# Patient Record
Sex: Male | Born: 1990 | Race: Black or African American | Hispanic: No | Marital: Single
Health system: Southern US, Community
[De-identification: ages and names within clinical notes are randomized; demographics above are authoritative.]

## PROBLEM LIST (undated history)

## (undated) DIAGNOSIS — F419 Anxiety disorder, unspecified: Secondary | ICD-10-CM

## (undated) DIAGNOSIS — F909 Attention-deficit hyperactivity disorder, unspecified type: Secondary | ICD-10-CM

---
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
## 2010-11-10 LAB — CBC
HCT: 39.7
Platelets: 182
RDW: 13.2

## 2010-11-10 LAB — POCT CARDIAC MARKERS
Myoglobin, poc: 168
Operator id: 277751
Troponin i, poc: <0.05

## 2010-11-10 LAB — I-STAT 8, (EC8 V) (CONVERTED LAB)
Bicarbonate: 22.5
Glucose, Bld: 95
Sodium: 140
TCO2: 24
pCO2, Ven: 40.4 — ABNORMAL LOW
pH, Ven: 7.354 — ABNORMAL HIGH

## 2010-11-10 LAB — POCT I-STAT CREATININE: Operator id: 277751

## 2010-11-10 LAB — ETHANOL: Alcohol, Ethyl (B): <5

## 2010-11-10 LAB — RAPID URINE DRUG SCREEN, HOSP PERFORMED: Cocaine: NOT DETECTED

## 2015-02-10 ENCOUNTER — Emergency Department (HOSPITAL_COMMUNITY)
Admission: EM | Admit: 2015-02-10 | Discharge: 2015-02-11 | Disposition: A | Discharge status: Home / Self Care | Payer: Federal, State, Local not specified - Other | Attending: Emergency Medicine | Admitting: Emergency Medicine

## 2015-02-10 ENCOUNTER — Encounter (HOSPITAL_COMMUNITY): Payer: Self-pay | Admitting: Emergency Medicine

## 2015-02-10 ENCOUNTER — Inpatient Hospital Stay (HOSPITAL_COMMUNITY)
Admission: AD | Admit: 2015-02-10 | Payer: Federal, State, Local not specified - Other | Source: Intra-hospital | Admitting: Psychiatry

## 2015-02-10 DIAGNOSIS — R000 Tachycardia, unspecified: Secondary | ICD-10-CM | POA: Insufficient documentation

## 2015-02-10 DIAGNOSIS — F19950 Other psychoactive substance use, unspecified with psychoactive substance-induced psychotic disorder with delusions: Secondary | ICD-10-CM | POA: Diagnosis present

## 2015-02-10 DIAGNOSIS — F12150 Cannabis abuse with psychotic disorder with delusions: Secondary | ICD-10-CM | POA: Insufficient documentation

## 2015-02-10 DIAGNOSIS — R030 Elevated blood-pressure reading, without diagnosis of hypertension: Secondary | ICD-10-CM | POA: Insufficient documentation

## 2015-02-10 DIAGNOSIS — F17200 Nicotine dependence, unspecified, uncomplicated: Secondary | ICD-10-CM | POA: Insufficient documentation

## 2015-02-10 DIAGNOSIS — F1210 Cannabis abuse, uncomplicated: Secondary | ICD-10-CM | POA: Diagnosis present

## 2015-02-10 DIAGNOSIS — F419 Anxiety disorder, unspecified: Secondary | ICD-10-CM | POA: Insufficient documentation

## 2015-02-10 HISTORY — DX: Attention-deficit hyperactivity disorder, unspecified type: F90.9

## 2015-02-10 LAB — CBC
HEMATOCRIT: 41.1 % (ref 39.0–52.0)
Hemoglobin: 14.2 g/dL (ref 13.0–17.0)
MCH: 27.8 pg (ref 26.0–34.0)
MCHC: 34.5 g/dL (ref 30.0–36.0)
MCV: 80.4 fL (ref 78.0–100.0)
Platelets: 166 10*3/uL (ref 150–400)
RBC: 5.11 MIL/uL (ref 4.22–5.81)
RDW: 13.3 % (ref 11.5–15.5)
WBC: 10.8 10*3/uL — ABNORMAL HIGH (ref 4.0–10.5)

## 2015-02-10 LAB — COMPREHENSIVE METABOLIC PANEL
ALT: 23 U/L (ref 17–63)
AST: 41 U/L (ref 15–41)
Albumin: 5.2 g/dL — ABNORMAL HIGH (ref 3.5–5.0)
Alkaline Phosphatase: 57 U/L (ref 38–126)
Anion gap: 12 (ref 5–15)
BUN: 26 mg/dL — ABNORMAL HIGH (ref 6–20)
CHLORIDE: 104 mmol/L (ref 101–111)
CO2: 26 mmol/L (ref 22–32)
CREATININE: 1.27 mg/dL — AB (ref 0.61–1.24)
Calcium: 9.9 mg/dL (ref 8.9–10.3)
GFR calc Af Amer: >60 mL/min (ref >60)
Glucose, Bld: 83 mg/dL (ref 65–99)
Potassium: 4 mmol/L (ref 3.5–5.1)
Sodium: 142 mmol/L (ref 135–145)
Total Bilirubin: 1.6 mg/dL — ABNORMAL HIGH (ref 0.3–1.2)
Total Protein: 8.3 g/dL — ABNORMAL HIGH (ref 6.5–8.1)

## 2015-02-10 LAB — RAPID URINE DRUG SCREEN, HOSP PERFORMED
Amphetamines: NOT DETECTED
BARBITURATES: NOT DETECTED
Benzodiazepines: NOT DETECTED
Cocaine: NOT DETECTED
Opiates: NOT DETECTED
Tetrahydrocannabinol: POSITIVE — AB

## 2015-02-10 LAB — ETHANOL

## 2015-02-10 MED ORDER — ZIPRASIDONE MESYLATE 20 MG IM SOLR: 10.0000 mg | Freq: Four times a day (QID) | INTRAMUSCULAR | Status: DC | PRN

## 2015-02-10 NOTE — ED Provider Notes (Deleted)
20:50- Nursing is concerned that pt. Is continuing to be agitated, asking and demanding things, confrontational, and not sleeping. I observed him pacing the hallway, talking to other patients, and responding appropriately to the nurses tal;king to him. He has prn medication ordered.   I see no reason to change the current treatment at this time.    Mancel BaleElliott Denitra Donaghey, MD 02/10/15 2056

## 2015-02-10 NOTE — ED Notes (Signed)
Dr. Effie ShyWentz informed that patient [facility].

## 2015-02-10 NOTE — ED Notes (Signed)
Dr. Wentz at bedside. 

## 2015-02-10 NOTE — Progress Notes (Signed)
Entered in d/c instructions Please use the resources provided to you in emergency room by case manager to assist with doctor for follow up Schedule an appointment as soon as possible for a visit These [place] uninsured resources provide possible primary care providers, resources for discounted medications, housing, dental resources, affordable care act information, plus other resources for [place]   

## 2015-02-10 NOTE — ED Notes (Signed)
Pt states he woke up this morning morning and saw rats trapped in rat traps all over him. Pt then stripped down naked in the front yard. Pt Grandmother stated she did not see any rats. Pt stated that he smoked marijuana yesterday.

## 2015-02-10 NOTE — ED Notes (Signed)
Patient has been picking/ripping at clothing since being brought back to the unit.  He has ripped up 3 pair of scrubs because he believes rats are crawling on him and he needs to get them off.  Mother was in to visit and states he is usually very mild mannered and polite.  She left after a few minutes.

## 2015-02-10 NOTE — ED Notes (Signed)
Bed: [id number] Expected date:  Expected time:  Means of arrival:  Comments: 24 y/o M bizzare behavior

## 2015-02-10 NOTE — ED Notes (Signed)
Patient denies SI, HI and AVH at the this time. Plan of care discussed. Patient voices no complaints or concerns at this time. Encouragement and support provided and safety maintain. Q 15 min safety checks remain in place.
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
## 2015-02-10 NOTE — Progress Notes (Signed)
CM spoke with [place] who [place].  CM discussed and provided written information for uninsured accepting pcps, discussed the importance of pcp vs EDP services for f/u care, www.needymeds.org, www.goodrx.com, discounted pharmacies and other [place] resources such as [facility] , [facility], affordable care act, financial assistance, uninsured dental services, [place] med assist, DSS and  health department  Reviewed resources for [place] uninsured accepting pcps like [facility], [facility] medicine [facility] [address], [facility], [facility], local urgent care centers, [facility], [facility], general medical clinics, family services of the [place], [facility] urgent care plus others, medication resources, [facility] out patient pharmacies and housing    Provided [facility] contact information Pt did not agree at this time Pt appeared angry with a frown and spoke in single syllables

## 2015-02-10 NOTE — ED Provider Notes (Signed)
CSN: [id number]     Arrival date & time 02/10/15  1250 History   First MD Initiated Contact with Patient 02/10/15 1310     Chief Complaint  Patient presents with  . Psychiatric Evaluation    HPI  Patient presents due to family concerns of psychosis. He denies any medical / psychiatric conditions. However, he describes setting traps for multiple traps for rodents over the past day, awakening to find multiple rats trapped, and wondering why his relatives could not see the records. Per report, the patient was also naked, walking about the front yard. The patient acknowledges smoking marijuana, denies other drug use, denies any medical complaints at all.   Past Medical History  Diagnosis Date  . ADHD (attention deficit hyperactivity disorder)    History reviewed. No pertinent past surgical history. No family history on file. Social History  Substance Use Topics  . Smoking status: Current Every Day Smoker  . Smokeless tobacco: None  . Alcohol Use: No    Review of Systems  Unable to perform ROS: Psychiatric disorder      Allergies  Review of patient's allergies indicates no known allergies.  Home Medications   Prior to Admission medications   Not on File   BP 171/103 mmHg  Pulse 105  Temp(Src) 99.4 F (37.4 C) (Oral)  Resp 16  SpO2 99% Physical Exam  Constitutional: He is oriented to person, place, and time. He appears well-developed. No distress.  HENT:  Head: Normocephalic and atraumatic.  Eyes: Conjunctivae and EOM are normal.  Cardiovascular: Regular rhythm.  Tachycardia present.   Pulmonary/Chest: Effort normal. No stridor. No respiratory distress.  Abdominal: He exhibits no distension.  Musculoskeletal: He exhibits no edema.  Neurological: He is alert and oriented to person, place, and time.  Skin: Skin is warm and dry.  Psychiatric: His mood appears anxious. His speech is tangential. Thought content is delusional. Cognition and memory are impaired. He  expresses no homicidal and no suicidal ideation.  Nursing note and vitals reviewed.   ED Course  Procedures (including critical care time) Labs Review Labs Reviewed  COMPREHENSIVE METABOLIC PANEL - Abnormal; Notable for the following:    BUN 26 (*)    Creatinine, Ser 1.27 (*)    Total Protein 8.3 (*)    Albumin 5.2 (*)    Total Bilirubin 1.6 (*)    All other components within normal limits  CBC - Abnormal; Notable for the following:    WBC 10.8 (*)    All other components within normal limits  URINE RAPID DRUG SCREEN, HOSP PERFORMED - Abnormal; Notable for the following:    Tetrahydrocannabinol POSITIVE (*)    All other components within normal limits  ETHANOL      MDM  Young male presents with concern for psychosis. Patient has minimal tachycardia, minimal blood pressure elevation, but no evidence for distress. Patient is medically cleared for psychiatric evaluation.   Gerhard Munchobert Haylen Bellotti, MD 02/10/15 [id number]

## 2015-02-10 NOTE — ED Notes (Signed)
Patient states " I'm not going to behavioral health. There is nothing wrong with me". Writer tried to encourage patient that this would be beneficial for him but patient continues to refuse. [place], TTS, couns, informed. Encouragement and support provided and safety maintain. Q 15 min safety checks remain in place.

## 2015-02-10 NOTE — ED Provider Notes (Signed)
21:00- Asked to see the patient to initiate IVC, because [facility]. At this time. He states that he was seeing "rats and rat poop", on him when he awoke this morning. He denies that it was a hallucination. He is unsure why. His mother would say this about him. He states that he is under stress but will not admit one about. He denies suicidal or homicidal ideation at this time. He states he does not use any drugs or alcohol.  At this time, the patient appears to be delusional. He does not appear safe for discharge. I discussed the situation with the on-call psychiatric provider, who will evaluate the patient, watch him overnight, and have the psychiatrist see him in the morning.  Mancel BaleElliott Adyson Vanburen, MD 02/10/15 2124

## 2015-02-11 DIAGNOSIS — F19950 Other psychoactive substance use, unspecified with psychoactive substance-induced psychotic disorder with delusions: Secondary | ICD-10-CM | POA: Diagnosis present

## 2015-02-11 DIAGNOSIS — F1210 Cannabis abuse, uncomplicated: Secondary | ICD-10-CM | POA: Diagnosis not present

## 2015-02-11 NOTE — ED Notes (Signed)
Patient discharged per MD order.  Patient received all personal belongings.  He has a bag of clothes that he did not want to touch.  He proceeded to throw them in the trash.  He believes rats had been in the clothing.  Reviewed discharge instructions and explained follow up.  Patient left ambulatory with his mother.  He denies SI/HI/AVH.

## 2015-02-11 NOTE — Discharge Instructions (Signed)
For your ongoing mental health needs, you are advised to follow up with [facility].  New and returning patients are seen at their walk-in clinic.  Walk-in hours are Monday - Friday from 8:00 am - 3:00 pm.  Walk-in patients are seen on a first come, first served basis.  Try to arrive as early as possible for he best chance of being seen the same day: ° °     [facility] °     [address] °     [place], [place] [address] °     [phone] °

## 2015-02-11 NOTE — ED Notes (Signed)
Patient states he doesn't need to be here.  Patient states he was seeing rats in his home.  "they were all in my clothes and I went out to car and they were in the tailpipe.  [facility].  He denies SI/HI/AVH.
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
## 2015-02-11 NOTE — [facility] Suicide Risk Assessment (Signed)
Suicide Risk Assessment  Discharge Assessment   [facility] Discharge Suicide Risk Assessment   Demographic Factors:  Male and Adolescent or young adult  Total Time spent with patient: 45 minutes  Musculoskeletal: Strength & Muscle Tone: within normal limits Gait & Station: normal Patient leans: N/A  Psychiatric Specialty Exam: Review of Systems  Constitutional: Negative.   HENT: Negative.   Eyes: Negative.   Respiratory: Negative.   Cardiovascular: Negative.   Gastrointestinal: Negative.   Genitourinary: Negative.   Musculoskeletal: Negative.   Skin: Negative.   Neurological: Negative.   Endo/Heme/Allergies: Negative.   Psychiatric/Behavioral: Positive for substance abuse.       Delusions    Blood pressure 132/77, pulse 67, temperature 98.2 F (36.8 C), temperature source Oral, resp. rate 18, SpO2 100 %.There is no height or weight on file to calculate BMI.  General Appearance: Casual  Eye Contact::  Good  Speech:  Normal Rate  Volume:  Normal  Mood:  Euthymic  Affect:  Congruent  Thought Process:  Coherent  Orientation:  Full (Time, Place, and Person)  Thought Content:  Delusions  Suicidal Thoughts:  No  Homicidal Thoughts:  No  Memory:  Immediate;   Good Recent;   Good Remote;   Good  Judgement:  Fair  Insight:  Fair  Psychomotor Activity:  Normal  Concentration:  Good  Recall:  Good  Fund of Knowledge:Good  Language: Good  Akathisia:  No  Handed:  Right  AIMS (if indicated):     Assets:  Housing Leisure Time Physical Health Resilience Social Support  ADL's:  Intact  Cognition: WNL  Sleep:      Has this patient used any form of tobacco in the last 30 days? (Cigarettes, Smokeless Tobacco, Cigars, and/or Pipes) Yes, A prescription for an FDA-approved tobacco cessation medication was offered at discharge and the patient refused  Mental Status Per Nursing Assessment::   On Admission:   Delusions  Current Mental Status by Physician: NA  Loss  Factors: NA  Historical Factors: NA  Risk Reduction Factors:   Sense of responsibility to family, Living with another person, especially a relative and Positive social support  Continued Clinical Symptoms:  Delusions, mild  Cognitive Features That Contribute To Risk:  None    Suicide Risk:  Minimal: No identifiable suicidal ideation.  Patients presenting with no risk factors but with morbid ruminations; may be classified as minimal risk based on the severity of the depressive symptoms  Principal Problem: Drug-induced psychotic disorder with delusions [facility]) Discharge Diagnoses:  Patient Active Problem List   Diagnosis Date Noted  . Marijuana abuse [F12.10] 02/11/2015    Priority: High  . Drug-induced psychotic disorder with delusions [facility]) [F19.950] 02/11/2015    Priority: High    Follow-up Information    Schedule an appointment as soon as possible for a visit with Please use the resources provided to you in emergency room by case manager to assist with doctor for follow up .   Contact information:   These [place] uninsured resources provide possible primary care providers, resources for discounted medications, housing, dental resources, affordable care act information, plus other resources for [employer]/Follow-up recommendations:  Activity:  as tolerated Diet:  heart heatlhy diet  Is patient on multiple antipsychotic therapies at discharge:  No   Has Patient had three or more failed trials of antipsychotic monotherapy by history:  No  Recommended Plan for Multiple Antipsychotic Therapies: NA  Nanine MeansLORD, Theodore Hendrix, PMh-NP 02/11/2015, 10:40 AM

## 2015-02-11 NOTE — ED Notes (Signed)
[facility] would not be coming over tonight. Dr. Effie ShyWentz and psych extender wants patient to be reevaluated by psychiatrist in the morning. Patient also informed by Clinical research associatewriter.

## 2015-02-11 NOTE — BH Assessment (Signed)
[facility] Assessment Progress Note  Per Gerald Taylor, MD, this patient does not require psychiatric hospitalization at this time.  He is to be discharged from [facility] with recommendation to follow up with [facility], his outpatient provider.  This has been included in pt's discharge instructions.  Pt's nurse, Caroline, has been notified.  Laurinda Carreno, MA Triage Specialist [phone]     

## 2016-12-30 ENCOUNTER — Encounter (HOSPITAL_COMMUNITY): Payer: Self-pay | Admitting: Emergency Medicine

## 2016-12-30 ENCOUNTER — Emergency Department (HOSPITAL_COMMUNITY)
Admission: EM | Admit: 2016-12-30 | Discharge: 2016-12-30 | Disposition: A | Discharge status: Home / Self Care | Payer: Self-pay | Attending: Emergency Medicine | Admitting: Emergency Medicine

## 2016-12-30 DIAGNOSIS — H1033 Unspecified acute conjunctivitis, bilateral: Secondary | ICD-10-CM | POA: Insufficient documentation

## 2016-12-30 DIAGNOSIS — F17200 Nicotine dependence, unspecified, uncomplicated: Secondary | ICD-10-CM | POA: Insufficient documentation

## 2016-12-30 LAB — URINALYSIS, ROUTINE W REFLEX MICROSCOPIC
BILIRUBIN URINE: NEGATIVE
GLUCOSE, UA: NEGATIVE mg/dL
Hgb urine dipstick: NEGATIVE
KETONES UR: NEGATIVE mg/dL
Leukocytes, UA: NEGATIVE
NITRITE: NEGATIVE
PH: 6 (ref 5.0–8.0)
Protein, ur: NEGATIVE mg/dL
SPECIFIC GRAVITY, URINE: 1.017 (ref 1.005–1.030)

## 2016-12-30 MED ORDER — CIPROFLOXACIN HCL 0.3 % OP SOLN: 2.0000 [drp] | OPHTHALMIC | 0 refills | Status: AC

## 2016-12-30 NOTE — ED Provider Notes (Signed)
[facility]-EMERGENCY DEPT Provider Note   CSN: [id number] Arrival date & time: 12/30/16  0941     History   Chief Complaint Chief Complaint  Patient presents with  . eye redness  . Emesis    HPI Theodore Hendrix is a 26 y.o. male.  HPI Patient presents with eye redness over the past 5-6 days with blurry vision.  He previously wore corrective lenses but no longer wears them.  He does not wear contact lenses.  No recent sick contacts.  He reports one episode of vomiting 5 days ago but none since then.  Denies abdominal pain.  Denies nausea vomiting diarrhea.  Symptoms are mild in severity.  No recent eye trauma.  Denies headache  Past Medical History:  Diagnosis Date  . ADHD (attention deficit hyperactivity disorder)     Patient Active Problem List   Diagnosis Date Noted  . Marijuana abuse 02/11/2015  . 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 No pertinent surgical history.     Home Medications    Prior to Admission medications   Medication Sig Start Date End Date Taking? Authorizing Provider  ciprofloxacin (CILOXAN) 0.3 % ophthalmic solution Place 2 drops every 4 (four) hours while awake for 7 days into both eyes. Administer 1 drop, every 2 hours, while awake, for 2 days. Then 1 drop, every 4 hours, while awake, for the next 5 days. 12/30/16 01/06/17  Azalia Bilisampos, Shyvonne Chastang, MD    Family History History reviewed. No pertinent family history.  Social History Social History   Tobacco Use  . Smoking status: Current Every Day Smoker  Substance Use Topics  . Alcohol use: No  . Drug use: Yes    Types: Marijuana     Allergies   Patient has no known allergies.   Review of Systems Review of Systems  All other systems reviewed and are negative.    Physical Exam Updated Vital Signs BP 122/83 (BP Location: Right Arm)   Pulse 64   Temp 98.1 F (36.7 C) (Oral)   Resp 17   Ht 6\' 2"  (1.88 m)   Wt  84.4 kg (186 lb)   SpO2 96%   BMI 23.88 kg/m   Physical Exam  Constitutional: He is oriented to person, place, and time. He appears well-developed and well-nourished.  HENT:  Head: Normocephalic.  Eyes: EOM are normal.  Bilateral conjunctival injection consistent with conjunctivitis  Neck: Normal range of motion.  Cardiovascular: Normal rate.  Pulmonary/Chest: Effort normal and breath sounds normal.  Abdominal: Soft. He exhibits no distension. There is no tenderness.  Musculoskeletal: Normal range of motion.  Neurological: He is alert and oriented to person, place, and time.  Psychiatric: He has a normal mood and affect.  Nursing note and vitals reviewed.    ED Treatments / Results  Labs (all labs ordered are listed, but only abnormal results are displayed) Labs Reviewed  URINALYSIS, ROUTINE W REFLEX MICROSCOPIC - Abnormal; Notable for the following components:      Result Value   Color, Urine STRAW (*)    All other components within normal limits    EKG  EKG Interpretation None       Radiology No results found.  Procedures Procedures (including critical care time)  Medications Ordered in ED Medications - No data to display   Initial Impression / Assessment and Plan / ED Course  I have reviewed the triage vital signs and the nursing notes.  Pertinent labs & imaging results  that were available during my care of the patient were reviewed by me and considered in my medical decision making (see chart for details).     Abdominal exam is nontender.  Acute conjunctivitis.  Home with antibiotics.  Primary care follow-up.  Patient understands return to the ER for new or worsening symptoms  Final Clinical Impressions(s) / ED Diagnoses   Final diagnoses:  Acute conjunctivitis of both eyes, unspecified acute conjunctivitis type    ED Discharge Orders        Ordered    ciprofloxacin (CILOXAN) 0.3 % ophthalmic solution  Every 4 hours while awake     12/30/16 1141         Azalia Bilisampos, Korynn Kenedy, MD 12/30/16 1143

## 2016-12-30 NOTE — ED Triage Notes (Signed)
Pt reports he has had bilateral eye redness and blurred vision for the past week. Has had emesis for the past 5 days. No diarrhea. Symptoms worsened by bending over.

## 2019-06-07 ENCOUNTER — Emergency Department (HOSPITAL_COMMUNITY): Payer: PRIVATE HEALTH INSURANCE

## 2019-06-07 ENCOUNTER — Emergency Department (HOSPITAL_COMMUNITY)
Admission: EM | Admit: 2019-06-07 | Discharge: 2019-06-08 | Disposition: A | Discharge status: Home / Self Care | Payer: PRIVATE HEALTH INSURANCE | Attending: Emergency Medicine | Admitting: Emergency Medicine

## 2019-06-07 ENCOUNTER — Encounter (HOSPITAL_COMMUNITY): Payer: Self-pay

## 2019-06-07 ENCOUNTER — Other Ambulatory Visit: Payer: Self-pay

## 2019-06-07 DIAGNOSIS — R0789 Other chest pain: Secondary | ICD-10-CM | POA: Diagnosis not present

## 2019-06-07 DIAGNOSIS — M549 Dorsalgia, unspecified: Secondary | ICD-10-CM

## 2019-06-07 DIAGNOSIS — S161XXA Strain of muscle, fascia and tendon at neck level, initial encounter: Secondary | ICD-10-CM | POA: Diagnosis not present

## 2019-06-07 DIAGNOSIS — Y999 Unspecified external cause status: Secondary | ICD-10-CM | POA: Diagnosis not present

## 2019-06-07 DIAGNOSIS — S199XXA Unspecified injury of neck, initial encounter: Secondary | ICD-10-CM | POA: Diagnosis present

## 2019-06-07 DIAGNOSIS — Y92488 Other paved roadways as the place of occurrence of the external cause: Secondary | ICD-10-CM | POA: Diagnosis not present

## 2019-06-07 DIAGNOSIS — R519 Headache, unspecified: Secondary | ICD-10-CM | POA: Insufficient documentation

## 2019-06-07 DIAGNOSIS — F17210 Nicotine dependence, cigarettes, uncomplicated: Secondary | ICD-10-CM | POA: Insufficient documentation

## 2019-06-07 DIAGNOSIS — Y93I9 Activity, other involving external motion: Secondary | ICD-10-CM | POA: Insufficient documentation

## 2019-06-07 DIAGNOSIS — F909 Attention-deficit hyperactivity disorder, unspecified type: Secondary | ICD-10-CM | POA: Diagnosis not present

## 2019-06-07 DIAGNOSIS — R109 Unspecified abdominal pain: Secondary | ICD-10-CM | POA: Diagnosis not present

## 2019-06-07 LAB — CBC
HCT: 49.4 % (ref 39.0–52.0)
Hemoglobin: 16.2 g/dL (ref 13.0–17.0)
MCH: 29 pg (ref 26.0–34.0)
MCHC: 32.8 g/dL (ref 30.0–36.0)
MCV: 88.5 fL (ref 80.0–100.0)
Platelets: 251 10*3/uL (ref 150–400)
RBC: 5.58 MIL/uL (ref 4.22–5.81)
RDW: 13.6 % (ref 11.5–15.5)
WBC: 15.5 10*3/uL — ABNORMAL HIGH (ref 4.0–10.5)
nRBC: 0 % (ref 0.0–0.2)

## 2019-06-07 LAB — ETHANOL: Alcohol, Ethyl (B): 254 mg/dL — ABNORMAL HIGH (ref <10)
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
## 2019-06-08 ENCOUNTER — Emergency Department (HOSPITAL_COMMUNITY): Payer: PRIVATE HEALTH INSURANCE

## 2019-06-08 ENCOUNTER — Other Ambulatory Visit: Payer: Self-pay

## 2019-06-08 LAB — COMPREHENSIVE METABOLIC PANEL
ALT: 46 U/L — ABNORMAL HIGH (ref 0–44)
AST: 53 U/L — ABNORMAL HIGH (ref 15–41)
Albumin: 4.2 g/dL (ref 3.5–5.0)
Alkaline Phosphatase: 54 U/L (ref 38–126)
Anion gap: 14 (ref 5–15)
BUN: 18 mg/dL (ref 6–20)
CO2: 23 mmol/L (ref 22–32)
Calcium: 9 mg/dL (ref 8.9–10.3)
Chloride: 103 mmol/L (ref 98–111)
Creatinine, Ser: 1.81 mg/dL — ABNORMAL HIGH (ref 0.61–1.24)
GFR calc Af Amer: 58 mL/min — ABNORMAL LOW (ref >60)
GFR calc non Af Amer: 50 mL/min — ABNORMAL LOW (ref >60)
Glucose, Bld: 89 mg/dL (ref 70–99)
Potassium: 3.7 mmol/L (ref 3.5–5.1)
Sodium: 140 mmol/L (ref 135–145)
Total Bilirubin: 0.4 mg/dL (ref 0.3–1.2)
Total Protein: 7.3 g/dL (ref 6.5–8.1)

## 2019-06-08 MED ORDER — MORPHINE SULFATE (PF) 4 MG/ML IV SOLN
4.0000 mg | Freq: Once | INTRAVENOUS | Status: AC
  Administered 2019-06-08: 4 mg via INTRAVENOUS
  Filled 2019-06-08: qty 1

## 2019-06-08 MED ORDER — IOHEXOL 300 MG/ML  SOLN
100.0000 mL | Freq: Once | INTRAMUSCULAR | Status: AC | PRN
  Administered 2019-06-08: 100 mL via INTRAVENOUS

## 2019-06-08 MED ORDER — METHOCARBAMOL 500 MG PO TABS: 500.0000 mg | ORAL_TABLET | Freq: Two times a day (BID) | ORAL | 0 refills | Status: AC

## 2019-06-08 MED ORDER — FENTANYL CITRATE (PF) 100 MCG/2ML IJ SOLN
50.0000 ug | Freq: Once | INTRAMUSCULAR | Status: AC
  Administered 2019-06-08: 50 ug via INTRAVENOUS
  Filled 2019-06-08: qty 2

## 2019-06-08 MED ORDER — LORAZEPAM 2 MG/ML IJ SOLN
0.5000 mg | Freq: Once | INTRAMUSCULAR | Status: AC
  Administered 2019-06-08: 0.5 mg via INTRAVENOUS
  Filled 2019-06-08: qty 1

## 2019-06-08 MED ORDER — ONDANSETRON HCL 4 MG/2ML IJ SOLN
4.0000 mg | Freq: Once | INTRAMUSCULAR | Status: AC
  Administered 2019-06-08: 4 mg via INTRAVENOUS
  Filled 2019-06-08: qty 2

## 2019-06-08 MED ORDER — KETOROLAC TROMETHAMINE 30 MG/ML IJ SOLN
30.0000 mg | Freq: Once | INTRAMUSCULAR | Status: AC
  Administered 2019-06-08: 30 mg via INTRAVENOUS
  Filled 2019-06-08: qty 1

## 2019-06-08 MED ORDER — FENTANYL CITRATE (PF) 100 MCG/2ML IJ SOLN
100.0000 ug | Freq: Once | INTRAMUSCULAR | Status: AC
  Administered 2019-06-08: 100 ug via INTRAVENOUS
  Filled 2019-06-08: qty 2

## 2019-06-08 NOTE — ED Provider Notes (Addendum)
Work-up complete at time of hand-off, however patient had continued to be reluctant to ambulate.  On my encounter, patient is simply worried about finding his phone.  I reviewed his work-up and imaging is entirely reassuring.  Patient was able to ambulate for me here in the ED.  Safe for discharge.  Disposition, plan, AVS, and prescribed outpatient medications per handoff provider.    Lorelee New, PA-C 06/08/19 0811    Lorelee New, PA-C 06/08/19 Grace Blight    Benjiman Core, MD 06/09/19 [phone]

## 2019-06-08 NOTE — ED Notes (Signed)
Pt given ginger ale, tolerated well

## 2019-06-08 NOTE — ED Notes (Signed)
Attempted to ambulate pt in room.  Unable to have pt move fully to side of bed with assitance before pt stating he is in too much pain to stand or walk.

## 2019-06-08 NOTE — ED Notes (Signed)
Pt found out of bed upon entering room. Able to get self out of bed and ambulate without assistance.

## 2019-06-08 NOTE — Progress Notes (Signed)
Orthopedic Tech Progress Note Patient Details:  Theodore Hendrix 1990/04/13 [id number]  Ortho Devices Type of Ortho Device: Soft collar Ortho Device/Splint Interventions: Ordered, Application, Adjustment   Post Interventions Patient Tolerated: Well Instructions Provided: Care of device, Adjustment of device   Trinna Post 06/08/2019, 6:48 AM

## 2019-06-14 ENCOUNTER — Other Ambulatory Visit: Payer: Self-pay

## 2019-06-14 DIAGNOSIS — F17200 Nicotine dependence, unspecified, uncomplicated: Secondary | ICD-10-CM | POA: Diagnosis not present

## 2019-06-14 DIAGNOSIS — Y92410 Unspecified street and highway as the place of occurrence of the external cause: Secondary | ICD-10-CM | POA: Insufficient documentation

## 2019-06-14 DIAGNOSIS — F909 Attention-deficit hyperactivity disorder, unspecified type: Secondary | ICD-10-CM | POA: Diagnosis not present

## 2019-06-14 DIAGNOSIS — S2231XA Fracture of one rib, right side, initial encounter for closed fracture: Secondary | ICD-10-CM | POA: Insufficient documentation

## 2019-06-14 DIAGNOSIS — S199XXA Unspecified injury of neck, initial encounter: Secondary | ICD-10-CM | POA: Insufficient documentation

## 2019-06-14 DIAGNOSIS — Y9389 Activity, other specified: Secondary | ICD-10-CM | POA: Diagnosis not present

## 2019-06-14 DIAGNOSIS — M79602 Pain in left arm: Secondary | ICD-10-CM | POA: Insufficient documentation

## 2019-06-14 DIAGNOSIS — M542 Cervicalgia: Secondary | ICD-10-CM | POA: Insufficient documentation

## 2019-06-14 DIAGNOSIS — F1210 Cannabis abuse, uncomplicated: Secondary | ICD-10-CM | POA: Diagnosis not present

## 2019-06-14 DIAGNOSIS — Y998 Other external cause status: Secondary | ICD-10-CM | POA: Insufficient documentation

## 2019-06-14 DIAGNOSIS — S299XXA Unspecified injury of thorax, initial encounter: Secondary | ICD-10-CM | POA: Diagnosis present

## 2019-06-15 ENCOUNTER — Emergency Department (HOSPITAL_COMMUNITY): Payer: No Typology Code available for payment source

## 2019-06-15 ENCOUNTER — Encounter (HOSPITAL_COMMUNITY): Payer: Self-pay

## 2019-06-15 ENCOUNTER — Emergency Department (HOSPITAL_COMMUNITY)
Admission: EM | Admit: 2019-06-15 | Discharge: 2019-06-15 | Disposition: A | Discharge status: Home / Self Care | Payer: No Typology Code available for payment source | Attending: Emergency Medicine | Admitting: Emergency Medicine

## 2019-06-15 DIAGNOSIS — M79602 Pain in left arm: Secondary | ICD-10-CM

## 2019-06-15 DIAGNOSIS — M542 Cervicalgia: Secondary | ICD-10-CM

## 2019-06-15 DIAGNOSIS — S2231XA Fracture of one rib, right side, initial encounter for closed fracture: Secondary | ICD-10-CM

## 2019-06-15 MED ORDER — OXYCODONE-ACETAMINOPHEN 5-325 MG PO TABS
1.0000 | ORAL_TABLET | Freq: Once | ORAL | Status: AC
  Administered 2019-06-15: 1 via ORAL
  Filled 2019-06-15: qty 1

## 2019-06-15 MED ORDER — MORPHINE SULFATE (PF) 4 MG/ML IV SOLN
4.0000 mg | Freq: Once | INTRAVENOUS | Status: AC
  Administered 2019-06-15: 06:00:00 4 mg via INTRAVENOUS
  Filled 2019-06-15: qty 1

## 2019-06-15 MED ORDER — MORPHINE SULFATE (PF) 4 MG/ML IV SOLN
4.0000 mg | Freq: Once | INTRAVENOUS | Status: AC
  Administered 2019-06-15: 4 mg via INTRAVENOUS
  Filled 2019-06-15: qty 1

## 2019-06-15 MED ORDER — MELOXICAM 15 MG PO TABS: 15.0000 mg | ORAL_TABLET | Freq: Every day | ORAL | 0 refills | Status: DC

## 2019-06-15 MED ORDER — OXYCODONE-ACETAMINOPHEN 5-325 MG PO TABS: 1.0000 | ORAL_TABLET | ORAL | 0 refills | Status: DC | PRN

## 2019-06-15 MED ORDER — AMOXICILLIN-POT CLAVULANATE 875-125 MG PO TABS: 1.0000 | ORAL_TABLET | Freq: Two times a day (BID) | ORAL | 0 refills | Status: DC

## 2019-06-15 NOTE — Discharge Instructions (Addendum)
Continue to wear your neck collar and follow up with ENT if you continue to have pain Take Augmentin twice a day for one week Take Mobic once a day for the next week for pain and inflammation Take Percocet as needed for severe pain
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
## 2019-06-15 NOTE — ED Notes (Signed)
Pt verbalized d/c instructions and follow up care. Alert and ambulatory. No iv. Calling for a ride home  

## 2020-05-07 ENCOUNTER — Emergency Department (HOSPITAL_COMMUNITY): Payer: Self-pay

## 2020-05-07 ENCOUNTER — Emergency Department (HOSPITAL_COMMUNITY)
Admission: EM | Admit: 2020-05-07 | Discharge: 2020-05-07 | Disposition: A | Discharge status: Home / Self Care | Payer: Self-pay | Attending: Emergency Medicine | Admitting: Emergency Medicine

## 2020-05-07 ENCOUNTER — Other Ambulatory Visit: Payer: Self-pay

## 2020-05-07 ENCOUNTER — Encounter (HOSPITAL_COMMUNITY): Payer: Self-pay

## 2020-05-07 DIAGNOSIS — R079 Chest pain, unspecified: Secondary | ICD-10-CM

## 2020-05-07 DIAGNOSIS — F410 Panic disorder [episodic paroxysmal anxiety] without agoraphobia: Secondary | ICD-10-CM | POA: Insufficient documentation

## 2020-05-07 DIAGNOSIS — F17200 Nicotine dependence, unspecified, uncomplicated: Secondary | ICD-10-CM | POA: Insufficient documentation

## 2020-05-07 NOTE — ED Triage Notes (Signed)
Patient BIB [facility] from the parking lot, he is homeless. Patient presents with anxiety. Patient has history of panic attacks.  EMS vitals 150/90 94 pulse 26 respirations 100% room air 108 CBG

## 2020-05-07 NOTE — ED Provider Notes (Signed)
[facility]   CSN: [id number] Arrival date & time: 05/07/20  2139     History Chief Complaint  Patient presents with  . Panic Attack    Theodore Hendrix is a 30 y.o. male.  30 yo M with a cc of of panic attack.  Patient says he gets these from time to time.  Feels like he has his heart beating fast and cannot stop it.  Usually improves with drinking water and having a calm discussion with someone.  He denies cough congestion or fever.  Later tells me that he has been having some chest pain.  Has trouble describing this.  Feels like his symptoms get better when he moves his whole body back and forth.  The history is provided by the patient.  Illness Severity:  Moderate Onset quality:  Gradual Duration:  2 days Timing:  Constant Progression:  Worsening Chronicity:  New Associated symptoms: chest pain   Associated symptoms: no abdominal pain, no congestion, no diarrhea, no fever, no headaches, no myalgias, no rash, no shortness of breath and no vomiting        Past Medical History:  Diagnosis Date  . ADHD (attention deficit hyperactivity disorder)     Patient Active Problem List   Diagnosis Date Noted  . Marijuana abuse 02/11/2015  . 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 No pertinent surgical history.     History reviewed. No pertinent family history.  Social History   Tobacco Use  . Smoking status: Current Every Day Smoker  Substance Use Topics  . Alcohol use: No  . Drug use: Yes    Types: Marijuana    Home Medications Prior to Admission medications   Medication Sig Start Date End Date Taking? Authorizing Provider  amoxicillin-clavulanate (AUGMENTIN) 875-125 MG tablet Take 1 tablet by mouth 2 (two) times daily. 06/15/19   Bethel Born, PA-C  meloxicam (MOBIC) 15 MG tablet Take 1 tablet (15 mg total) by mouth daily. 06/15/19   Bethel Born, PA-C   oxyCODONE-acetaminophen (PERCOCET/ROXICET) 5-325 MG tablet Take 1 tablet by mouth every 4 (four) hours as needed for severe pain. 06/15/19   Bethel Born, PA-C    Allergies    Patient has no known allergies.  Review of Systems   Review of Systems  Constitutional: Negative for chills and fever.  HENT: Negative for congestion and facial swelling.   Eyes: Negative for discharge and visual disturbance.  Respiratory: Negative for shortness of breath.   Cardiovascular: Positive for chest pain. Negative for palpitations.  Gastrointestinal: Negative for abdominal pain, diarrhea and vomiting.  Musculoskeletal: Negative for arthralgias and myalgias.  Skin: Negative for color change and rash.  Neurological: Negative for tremors, syncope and headaches.  Psychiatric/Behavioral: Negative for confusion and dysphoric mood. The patient is nervous/anxious.     Physical Exam Updated Vital Signs BP (!) 114/102 (BP Location: Left Arm)   Pulse 96   Temp 98.2 F (36.8 C) (Oral)   Resp 20   Ht 6\' 3"  (1.905 m)   Wt 83.9 kg   SpO2 100%   BMI 23.12 kg/m   Physical Exam Vitals and nursing note reviewed.  Constitutional:      Appearance: He is well-developed.  HENT:     Head: Normocephalic and atraumatic.  Eyes:     Pupils: Pupils are equal, round, and reactive to light.  Neck:     Vascular: No JVD.  Cardiovascular:  Rate and Rhythm: Normal rate and regular rhythm.     Heart sounds: No murmur heard. No friction rub. No gallop.   Pulmonary:     Effort: No respiratory distress.     Breath sounds: No wheezing.  Abdominal:     General: There is no distension.     Tenderness: There is no abdominal tenderness. There is no guarding or rebound.  Musculoskeletal:        General: Normal range of motion.     Cervical back: Normal range of motion and neck supple.     Comments: Moving lower extremities and no rhythmic motion.  Fatigable.  Stops when he does not think anyone is paying  attention.  Skin:    Coloration: Skin is not pale.     Findings: No rash.  Neurological:     Mental Status: He is alert and oriented to person, place, and time.  Psychiatric:        Behavior: Behavior normal.     ED Results / Procedures / Treatments   Labs (all labs ordered are listed, but only abnormal results are displayed) Labs Reviewed - No data to display  EKG None  Radiology DG Chest 1 View  Result Date: 05/07/2020 CLINICAL DATA:  Mid chest pain, anxiety, history of panic attacks, current smoker EXAM: CHEST  1 VIEW COMPARISON:  CT 06/08/2019, radiograph 06/07/2019 FINDINGS: Low volumes and atelectasis with vascular crowding. No consolidative process or convincing features of edema. No pneumothorax or effusion. The aorta is calcified. The remaining cardiomediastinal contours are unremarkable. No acute osseous or soft tissue abnormality. IMPRESSION: Low volumes and atelectasis. Electronically Signed   By: Kreg Shropshire M.D.   On: 05/07/2020 23:04    Procedures Procedures   Medications Ordered in ED Medications - No data to display  ED Course  I have reviewed the triage vital signs and the nursing notes.  Pertinent labs & imaging results that were available during my care of the patient were reviewed by me and considered in my medical decision making (see chart for details).    MDM Rules/Calculators/A&P                          30 yo M with a cc of an anxiety attack.  Tells me that these happen fairly frequently and that usually they improved with drinking water and resting.  I have a strong suspicion that the patient is seeking secondary gain as he is also mention to multiple people that he is homeless and is looking for a place to stay tonight.  He stops his all over body twitching when he thinks that no one is looking.  EKG with no obvious pathology though difficult baseline with the patient constantly shaking.  Chest x-ray viewed by me without focal infiltrate or  pneumothorax.  Will discharge the patient home.  PCP follow-up.  11:14 PM:  I have discussed the diagnosis/risks/treatment options with the patient and believe the pt to be eligible for discharge home to follow-up with PCP. We also discussed returning to the ED immediately if new or worsening sx occur. We discussed the sx which are most concerning (e.g., sudden worsening pain, fever, inability to tolerate by mouth) that necessitate immediate return. Medications administered to the patient during their visit and any new prescriptions provided to the patient are listed below.  Medications given during this visit Medications - No data to display   The patient appears reasonably screen and/or stabilized  for discharge and I doubt any other medical condition or other [facility] requiring further screening, evaluation, or treatment in the ED at this time prior to discharge.   Final Clinical Impression(s) / ED Diagnoses Final diagnoses:  Nonspecific chest pain    Rx / DC Orders ED Discharge Orders    None       Melene Plan, DO 05/07/20 2314

## 2020-05-07 NOTE — ED Notes (Signed)
Patient given food/drink.  

## 2020-05-07 NOTE — Discharge Instructions (Signed)
Follow up with your doctor.  Return for worsening symptoms.  
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
## 2021-06-08 ENCOUNTER — Ambulatory Visit (HOSPITAL_COMMUNITY)
Admission: EM | Admit: 2021-06-08 | Discharge: 2021-06-08 | Disposition: A | Discharge status: Home / Self Care | Payer: No Payment, Other | Attending: Nurse Practitioner | Admitting: Nurse Practitioner

## 2021-06-08 ENCOUNTER — Ambulatory Visit (HOSPITAL_COMMUNITY)
Admission: EM | Admit: 2021-06-08 | Discharge: 2021-06-08 | Disposition: A | Discharge status: Home / Self Care | Payer: No Payment, Other | Source: Home / Self Care

## 2021-06-08 ENCOUNTER — Encounter (HOSPITAL_COMMUNITY): Payer: Self-pay | Admitting: Psychiatry

## 2021-06-08 DIAGNOSIS — F4321 Adjustment disorder with depressed mood: Secondary | ICD-10-CM

## 2021-06-08 DIAGNOSIS — F4323 Adjustment disorder with mixed anxiety and depressed mood: Secondary | ICD-10-CM | POA: Insufficient documentation

## 2021-06-08 NOTE — Progress Notes (Signed)
TRIAGE: ROUTINE ?Theodore Romp, NP, reviewed pt's chart and information and met with pt face-to-face and determined pt can be psych cleared with information for outpatient services. ? ? 06/08/21 0300  ?Theodore Hendrix Triage Screening (Walk-ins at [facility] only)  ?How Did You Hear About [place]? Family/Friend  ?What Is the Reason for Your Visit/Call Today? Pt states, "I want to be better and feel better. I'm tired of feeling like I'm not appreciated." Pt shares he pretends everything is fine with his friends and family but that he feels like he doesn't want to be here. Pt shares he has difficulties sleeping and eating and that he finds himself crying frequently. Pt was tearful throughout triage. Pt acknowledges having thoughts about jumping off of a bridge. He denies any prior attemps or any prior hospitalizations. Pt denies he's received treatment in the past, stating he was not willing to admit he was having difficulties. Pt denies HI, AVH, NSSIB, access to guns/weapons, engagement with the legal system, and SA.  ?How Long Has This Been Causing You Problems? > than 6 months  ?Have You Recently Had Any Thoughts About Hurting Yourself? Yes  ?How long ago did you have thoughts about hurting yourself? Currently  ?Are You Planning to Commit Suicide/Harm Yourself At This time? Yes  ?Have you Recently Had Thoughts About Theodore Hendrix? No  ?Are You Planning To Harm Someone At This Time? No  ?Are you currently experiencing any auditory, visual or other hallucinations? No  ?Have You Used Any Alcohol or Drugs in the Past 24 Hours? No  ?Do you have any current medical co-morbidities that require immediate attention? No  ?Clinician description of patient physical appearance/behavior: Pt is dressed in age-appropriate attire. He is able to answer the questions posed and identify his thoughts, feelings, and concerns. Pt was tearful throughout the assessment.  ?What Do You Feel Would Help You the Most Today? Medication(s);Treatment for  Depression or other mood problem  ?If access to [facility] was not available, would you have sought care in the Emergency Department? Yes  ?Determination of Need Urgent (48 hours)  ?Options For Referral Medication Management;Outpatient Therapy  ? ? ?

## 2021-06-08 NOTE — ED Triage Notes (Signed)
Pt presents voluntarily escorted by [employer]. Pt states, "I'm tired of people not appreciating me." Pt states that he feels like he does not have support at the moment and the people that are around him do not appreciate him. Pt states that he has been feeling depressed. Pt states that he has been homeless for 1 day. Pt states that he has thoughts of SI yesterday but states he does not feel this way at the moment.Pt states " I need help trying to get my living situation together". Pt denies SI/HI and AVH at this time. ?

## 2021-06-08 NOTE — Discharge Instructions (Addendum)
?  Assessment/Therapy Walk-Ins will be available on Monday and Wednesday?s Only ? ?Monday/Wednesday: 8 AM until slots are full ? ?For Monday to Wednesday, it is recommended that patients arrive between 7:30 AM and 7:45 AM because patients will be seen in the order of arrival.  Go to the second floor on arrival and check in. ? ?**Availability is limited; therefore, patients may not be seen on the same day.** ? ?Psychiatry/Medication Management Walk-Ins will be available on Monday, Wednesday, Thursday and Friday ? ?Monday/Wednesday/Thursday/Friday: 8 AM to 11 AM. ? ?It is recommended that patients arrive by 7:30 AM to 7:45 AM because patients will be seen in the order of arrival.  Go to the second floor on arrival and check in. ? ?**Availability is limited; therefore, patients may not be seen on the same day.**  ? ? ?_______________________________________________________________________________ ?Discharge recommendations:  ?Patient is to take medications as prescribed. ?Please see information for follow-up appointment with psychiatry and therapy. ?Please follow up with your primary care provider for all medical related needs.  ? ?Therapy: We recommend that patient participate in individual therapy to address mental health concerns. ? ?Medications: The patient is to contact a medical professional and/or outpatient provider to address any new side effects that develop. Patient should update outpatient providers of any new medications and/or medication changes.  ? ?Atypical antipsychotics: If you are prescribed an atypical antipsychotic, it is recommended that your height, weight, BMI, blood pressure, fasting lipid panel, and fasting blood sugar be monitored by your outpatient providers. ? ?Safety:  ?The patient should abstain from use of illicit substances/drugs and abuse of any medications. ?If symptoms worsen or do not continue to improve or if the patient becomes actively suicidal or homicidal then it is recommended  that the patient return to the closest hospital emergency department, the [facility], or call 911 for further evaluation and treatment. ?National Suicide Prevention Lifeline 1-800-SUICIDE or 415-814-5069. ? ?About 988 ?988 offers 24/7 access to trained crisis counselors who can help people experiencing mental health-related distress. People can call or text 988 or chat 988lifeline.org for themselves or if they are worried about a loved one who may need crisis support.  ?

## 2021-06-08 NOTE — ED Provider Notes (Signed)
Behavioral Health Urgent Care Medical Screening Exam ? ?Patient Name: Theodore Hendrix ?MRN: [id number] ?Date of Evaluation: 06/08/21 ?Chief Complaint:   ?Diagnosis:  ?Final diagnoses:  ?Adjustment disorder with depressed mood  ? ? ?History of Present illness: Theodore Hendrix is a 31 y.o. male.  Who was seen last night, presented again because patient states he does not have a place to live.  He reports that he works a few hours at [employer] and has just recently started there.  Patient has that he had a disagreement with his grandparents and so he does not have a place to live.  He has that he is looking for housing, is not suicidal or homicidal or psychotic.  He has that he does not want to be on the streets.  Patient reports that he is not abusing alcohol, does not use tobacco, does not use illicit substances.  He states that he is okay with going to the ER or see as he would like a place to stay.  Also discussed with patient with the stress of his family if he would like to be seen outpatient at the [facility] outpatient, gave him timings for open access. ? ?Patient reports that at his job he does not get enough of hours, cannot afford housing, discussed that at the [facility] they can help him look for other job options.  Patient states that that is what he is looking for currently, will follow up outpatient once his housing and financial situation is better.  Discussed again with patient the need to access the outpatient services provided here while he is looking for housing as it would be helpful for him ? ?Patient denies any psychotic symptoms, any thoughts of self-harm, harm to others. ? ?Psychiatric Specialty Exam ? ?Presentation  ?General Appearance:Appropriate for Environment; Casual ? ?Eye Contact:Good ? ?Speech:Clear and Coherent ? ?Speech Volume:Normal ? ?Handedness:Right ? ? ?Mood and Affect  ?Mood:Euthymic ? ?Affect:Appropriate; Full Range ? ? ?Thought Process  ?Thought Processes:Coherent; Goal  Directed ? ?Descriptions of Associations:Intact ? ?Orientation:Full (Time, Place and Person) ? ?Thought Content:Logical; WDL ?   Hallucinations:None ? ?Ideas of Reference:None ? ?Suicidal Thoughts:No ? ?Homicidal Thoughts:No ? ? ?Sensorium  ?Memory:Immediate Fair; Recent Fair; Remote Fair ? ?Judgment:Fair ? ?Insight:Present ? ? ?Executive Functions  ?Concentration:Fair ? ?Attention Span:Fair ? ?Recall:Fair ? ?Fund of Knowledge:Fair ? ?Language:Fair ? ? ?Psychomotor Activity  ?Psychomotor Activity:Normal ? ? ?Assets  ?Assets:Desire for Improvement; Communication Skills; Talents/Skills ? ? ?Sleep  ?Sleep:Fair ? ?Number of hours: No data recorded ? ?Nutritional Assessment (For OBS and FBC admissions only) ?Has the patient had a weight loss or gain of 10 pounds or more in the last 3 months?: No ?Has the patient had a decrease in food intake/or appetite?: Yes ?Does the patient have dental problems?: No ?Does the patient have eating habits or behaviors that may be indicators of an eating disorder including binging or inducing vomiting?: No ?Has the patient recently lost weight without trying?: 0 ?Has the patient been eating poorly because of a decreased appetite?: 1 ?Malnutrition Screening Tool Score: 1 ? ? ? ?Physical Exam: ?Physical Exam ?Constitutional:   ?   Appearance: Normal appearance.  ?HENT:  ?   Head: Normocephalic and atraumatic.  ?   Nose: Nose normal.  ?Eyes:  ?   Extraocular Movements: Extraocular movements intact.  ?   Conjunctiva/sclera: Conjunctivae normal.  ?   Pupils: Pupils are equal, round, and reactive to light.  ?Cardiovascular:  ?   Rate and Rhythm: Normal  rate and regular rhythm.  ?Pulmonary:  ?   Effort: Pulmonary effort is normal.  ?   Breath sounds: Normal breath sounds.  ?Musculoskeletal:     ?   General: Normal range of motion.  ?   Cervical back: Normal range of motion and neck supple.  ?Skin: ?   General: Skin is warm and dry.  ?Neurological:  ?   General: No focal deficit present.  ?    Mental Status: He is alert and oriented to person, place, and time.  ?Psychiatric:     ?   Behavior: Behavior normal.     ?   Thought Content: Thought content normal.     ?   Judgment: Judgment normal.  ? ?ROS ?Blood pressure (!) 156/97, pulse 81, resp. rate 18, SpO2 100 %. There is no height or weight on file to calculate BMI. ? ?Musculoskeletal: ?Strength & Muscle Tone: within normal limits ?Gait & Station: normal ?Patient leans: N/A ? ? ?[facility] MSE Discharge Disposition for Follow up and Recommendations: ?Based on my evaluation the patient does not appear to have an emergency medical condition and can be discharged with resources and follow up care in outpatient services for resources along with bus pass given for [facility], information of [facility] outpatient services with open access hours given to patient.  Crisis and safety planning done in length with patient, discussed using 9 09/27/18 24 /7 for any crisis needs ,also the [facility] behavioral health urgent care ? ?Patient was seen at 3 AM in the morning, at that time he reported alcohol use, vaping which he denied to me.  Patient reports that his biggest concern is not having a place to stay as his family will not let him return.  Patient also in triage clearly stated that he was not suicidal, just needed help with housing.  Patient given bus pass to get to the [facility] to help with his housing concerns ?Nelly Rout, MD ?06/08/2021, 2:08 PM ? ?

## 2021-06-08 NOTE — ED Provider Notes (Signed)
[facility] Medical Screening Exam ? ?Patient Name: Theodore Hendrix ?MRN: [id number] ?Date of Evaluation: 06/08/21 ?Chief Complaint:   ?Diagnosis:  ?Final diagnoses:  ?Adjustment disorder with mixed anxiety and depressed mood  ? ? ?History of Present illness: Theodore Hendrix is a 31 y.o. male who presents to [facility] as a walk-in due to depression and anxiety. Patient states that he would like to get assistance before his becomes suicidal. Patient reported SI to TTS; however, he denies SI to this provider. On evaluation patient is alert and oriented x 4, pleasant, and cooperative. Speech is clear and coherent. Mood is depressed and affect is congruent with mood. Thought process is coherent and thought content is logical. Denies auditory and visual hallucinations. No indication that patient is responding to internal stimuli. No evidence of delusional thought content. Denies suicidal ideations. Denies homicidal ideations. Reports occasional use of alcohol. Reports vaping nicotine products. Denies use of marijuana and other substances.  ? ?Psychiatric Specialty Exam ? ?Presentation  ?General Appearance:Appropriate for Environment; Fairly Groomed ? ?Eye Contact:Good ? ?Speech:Clear and Coherent; Normal Rate ? ?Speech Volume:Decreased ? ?Handedness:Right ? ? ?Mood and Affect  ?Mood:Anxious; Depressed ? ?Affect:Congruent; Depressed ? ? ?Thought Process  ?Thought Processes:Coherent; Goal Directed; Linear ? ?Descriptions of Associations:Intact ? ?Orientation:Full (Time, Place and Person) ? ?Thought Content:Logical ?   Hallucinations:None ? ?Ideas of Reference:None ? ?Suicidal Thoughts:No ? ?Homicidal Thoughts:No ? ? ?Sensorium  ?Memory:Immediate Good; Recent Good ? ?Judgment:Fair ? ?Insight:Fair ? ? ?Executive Functions  ?Concentration:Good ? ?Attention Span:Good ? ?Recall:Good ? ?Fund of [place] ? ?Language:Good ? ? ?Psychomotor Activity  ?Psychomotor Activity:Normal ? ? ?Assets  ?Assets:Desire for  Improvement; Physical Health ? ? ?Sleep  ?Sleep:Fair ? ?Number of hours: No data recorded ? ?Nutritional Assessment (For OBS and FBC admissions only) ?Has the patient had a weight loss or gain of 10 pounds or more in the last 3 months?: No ?Has the patient had a decrease in food intake/or appetite?: Yes ?Does the patient have dental problems?: No ?Does the patient have eating habits or behaviors that may be indicators of an eating disorder including binging or inducing vomiting?: No ?Has the patient recently lost weight without trying?: 0 ?Has the patient been eating poorly because of a decreased appetite?: 1 ?Malnutrition Screening Tool Score: 1 ? ? ? ?Physical Exam: ?Physical Exam ?Constitutional:   ?   General: He is not in acute distress. ?   Appearance: He is not ill-appearing, toxic-appearing or diaphoretic.  ?HENT:  ?   Head: Normocephalic.  ?   Right Ear: External ear normal.  ?   Left Ear: External ear normal.  ?Eyes:  ?   Pupils: Pupils are equal, round, and reactive to light.  ?Cardiovascular:  ?   Rate and Rhythm: Normal rate.  ?Pulmonary:  ?   Effort: Pulmonary effort is normal. No respiratory distress.  ?Musculoskeletal:     ?   General: Normal range of motion.  ?Skin: ?   General: Skin is warm and dry.  ?Neurological:  ?   Mental Status: He is alert and oriented to person, place, and time.  ?Psychiatric:     ?   Mood and Affect: Mood is anxious and depressed.     ?   Speech: Speech normal.     ?   Behavior: Behavior is cooperative.     ?   Thought Content: Thought content is not paranoid or delusional. Thought content does not include homicidal or suicidal ideation. Thought content does not include  suicidal plan.  ? ?Review of Systems  ?Constitutional:  Negative for chills, diaphoresis, fever, malaise/fatigue and weight loss.  ?HENT:  Negative for congestion.   ?Respiratory:  Negative for cough and shortness of breath.   ?Cardiovascular:  Negative for chest pain and palpitations.  ?Gastrointestinal:   Negative for diarrhea, nausea and vomiting.  ?Neurological:  Negative for dizziness and seizures.  ?Psychiatric/Behavioral:  Positive for depression and suicidal ideas. Negative for hallucinations, memory loss and substance abuse. The patient is nervous/anxious and has insomnia.   ?All other systems reviewed and are negative. ? ?Blood pressure 139/86, pulse 99, temperature 98.3 ?F (36.8 ?C), temperature source Oral, resp. rate 18, SpO2 99 %. There is no height or weight on file to calculate BMI. ? ?Musculoskeletal: ?Strength & Muscle Tone: within normal limits ?Gait & Station: normal ?Patient leans: N/A ? ? ?[facility] Discharge Disposition for Follow up and Recommendations: ?Based on my evaluation the patient does not appear to have an emergency medical condition and can be discharged with resources and follow up care in outpatient services for Medication Management and Individual Therapy ? ? ? ?Discharge Instructions   ? ?  ? ?Assessment/Therapy Walk-Ins will be available on Monday and Wednesday?s Only ? ?Monday/Wednesday: 8 AM until slots are full ? ?For Monday to Wednesday, it is recommended that patients arrive between 7:30 AM and 7:45 AM because patients will be seen in the order of arrival.  Go to the second floor on arrival and check in. ? ?**Availability is limited; therefore, patients may not be seen on the same day.** ? ?Psychiatry/Medication Management Walk-Ins will be available on Monday, Wednesday, Thursday and Friday ? ?Monday/Wednesday/Thursday/Friday: 8 AM to 11 AM. ? ?It is recommended that patients arrive by 7:30 AM to 7:45 AM because patients will be seen in the order of arrival.  Go to the second floor on arrival and check in. ? ?**Availability is limited; therefore, patients may not be seen on the same day.**  ? ? ?_______________________________________________________________________________ ?Discharge recommendations:  ?Patient is to take medications as prescribed. ?Please see information for  follow-up appointment with psychiatry and therapy. ?Please follow up with your primary care provider for all medical related needs.  ? ?Therapy: We recommend that patient participate in individual therapy to address mental health concerns. ? ?Medications: The patient is to contact a medical professional and/or outpatient provider to address any new side effects that develop. Patient should update outpatient providers of any new medications and/or medication changes.  ? ?Atypical antipsychotics: If you are prescribed an atypical antipsychotic, it is recommended that your height, weight, BMI, blood pressure, fasting lipid panel, and fasting blood sugar be monitored by your outpatient providers. ? ?Safety:  ?The patient should abstain from use of illicit substances/drugs and abuse of any medications. ?If symptoms worsen or do not continue to improve or if the patient becomes actively suicidal or homicidal then it is recommended that the patient return to the closest hospital emergency department, the [facility], or call 911 for further evaluation and treatment. ?National Suicide Prevention Lifeline 1-800-SUICIDE or 914-631-8365. ? ?About 988 ?988 offers 24/7 access to trained crisis counselors who can help people experiencing mental health-related distress. People can call or text 988 or chat 988lifeline.org for themselves or if they are worried about a loved one who may need crisis support.  ? ? ?  ? ? ?Rozetta Nunnery, NP ?06/08/2021, 3:57 AM ? ?

## 2021-06-08 NOTE — Discharge Instructions (Addendum)
Patient is instructed prior to discharge to: ? Take all medications as prescribed by his/her mental healthcare provider. ?Report any adverse effects and or reactions from the medicines to his/her outpatient provider promptly. ?Keep all scheduled appointments, to ensure that you are getting refills on time and to avoid any interruption in your medication.  If you are unable to keep an appointment call to reschedule.  Be sure to follow-up with resources and follow-up appointments provided.  ?Patient has been instructed & cautioned: To not engage in alcohol and or illegal drug use while on prescription medicines. ?In the event of worsening symptoms, patient is instructed to call the crisis hotline, 911 and or go to the nearest ED for appropriate evaluation and treatment of symptoms. ?To follow-up with his/her primary care provider for your other medical issues, concerns and or health care needs. ?  ? ?Homeless Shelter List: ? ?  ? ?[facility] ([facility]) ? ?[address] [place], [place] ? ?Phone: [phone] ? ?  ? ?[facility] ? ?[address], [place], [place] [address] ? ?Phone: [phone] ? ?  ? ?[facility] (Women only) ? ?[address], [place], [place] [address] ? ?Phone: [phone] ? ?  ? ?[facility] ? ?[address] [place], [place] [address] ? ?Phone: [phone] ? ?  ? ?[facility]: ? ?[address] ? ?[place], [place] [address] ? ?Phone: [phone] ? ?  ? ?[facility] ? ?[address], [place], [place] [address] ? ?(Check in at 6:00PM for placement at a local shelter) ? ?Phone: [phone] ? ?

## 2021-06-08 NOTE — ED Notes (Signed)
Patient was discharged from [facility] by the provider. Patient was given community resources on his AVS. ?

## 2021-06-10 IMAGING — CR DG CHEST 1V
1 series · 1 of 1 positions shown · non-contrast
Comparison: CT 06/08/2019, radiograph 06/07/2019

CLINICAL DATA: Mid chest pain, anxiety, history of panic attacks,
current smoker

EXAM:
CHEST  1 VIEW

[x chest ap]
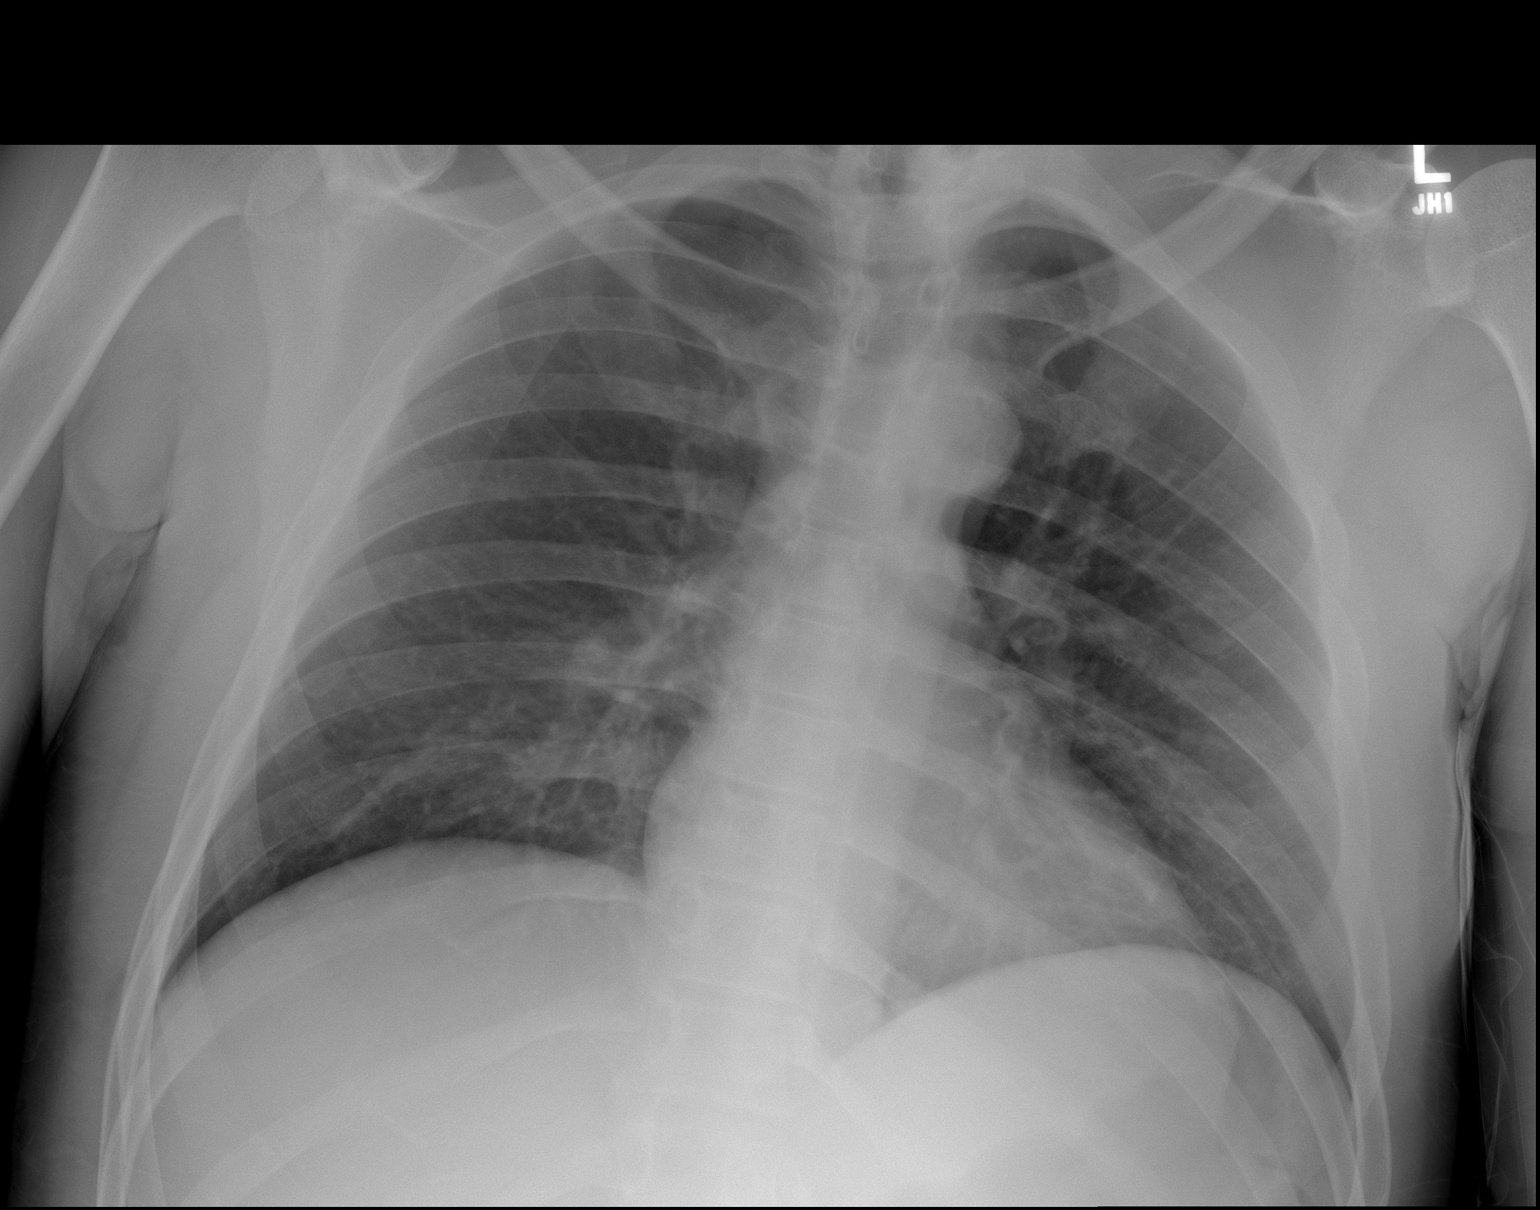

[1 of 1 positions shown; findings below may reference images not displayed]

FINDINGS: Low volumes and atelectasis with vascular crowding. No consolidative
process or convincing features of edema. No pneumothorax or
effusion. The aorta is calcified. The remaining cardiomediastinal
contours are unremarkable. No acute osseous or soft tissue
abnormality.
IMPRESSION: Low volumes and atelectasis.

## 2021-09-05 ENCOUNTER — Encounter (HOSPITAL_COMMUNITY): Payer: Self-pay

## 2021-09-05 ENCOUNTER — Other Ambulatory Visit: Payer: Self-pay

## 2021-09-05 ENCOUNTER — Emergency Department (HOSPITAL_COMMUNITY)
Admission: EM | Admit: 2021-09-05 | Discharge: 2021-09-05 | Disposition: A | Discharge status: Home / Self Care | Payer: Self-pay | Attending: Emergency Medicine | Admitting: Emergency Medicine

## 2021-09-05 DIAGNOSIS — R079 Chest pain, unspecified: Secondary | ICD-10-CM | POA: Insufficient documentation

## 2021-09-05 DIAGNOSIS — Z79899 Other long term (current) drug therapy: Secondary | ICD-10-CM | POA: Insufficient documentation

## 2021-09-05 DIAGNOSIS — F32A Depression, unspecified: Secondary | ICD-10-CM | POA: Insufficient documentation

## 2021-09-05 DIAGNOSIS — F419 Anxiety disorder, unspecified: Secondary | ICD-10-CM | POA: Insufficient documentation

## 2021-09-05 DIAGNOSIS — R531 Weakness: Secondary | ICD-10-CM | POA: Insufficient documentation

## 2021-09-05 HISTORY — DX: Anxiety disorder, unspecified: F41.9

## 2021-09-05 LAB — CBC WITH DIFFERENTIAL/PLATELET
Abs Immature Granulocytes: 0.18 10*3/uL — ABNORMAL HIGH (ref 0.00–0.07)
Basophils Absolute: 0 10*3/uL (ref 0.0–0.1)
Basophils Relative: 0 %
Eosinophils Absolute: 0.1 10*3/uL (ref 0.0–0.5)
Eosinophils Relative: 1 %
HCT: 50.3 % (ref 39.0–52.0)
Hemoglobin: 16.6 g/dL (ref 13.0–17.0)
Immature Granulocytes: 2 %
Lymphocytes Relative: 46 %
Lymphs Abs: 5.5 10*3/uL — ABNORMAL HIGH (ref 0.7–4.0)
MCH: 28.4 pg (ref 26.0–34.0)
MCHC: 33 g/dL (ref 30.0–36.0)
MCV: 86 fL (ref 80.0–100.0)
Monocytes Absolute: 0.6 10*3/uL (ref 0.1–1.0)
Monocytes Relative: 5 %
Neutro Abs: 5.5 10*3/uL (ref 1.7–7.7)
Neutrophils Relative %: 46 %
Platelets: 244 10*3/uL (ref 150–400)
RBC: 5.85 MIL/uL — ABNORMAL HIGH (ref 4.22–5.81)
RDW: 13.7 % (ref 11.5–15.5)
WBC: 12 10*3/uL — ABNORMAL HIGH (ref 4.0–10.5)
nRBC: 0 % (ref 0.0–0.2)

## 2021-09-05 LAB — COMPREHENSIVE METABOLIC PANEL
ALT: 28 U/L (ref 0–44)
AST: 25 U/L (ref 15–41)
Albumin: 3.6 g/dL (ref 3.5–5.0)
Alkaline Phosphatase: 36 U/L — ABNORMAL LOW (ref 38–126)
Anion gap: 9 (ref 5–15)
BUN: 13 mg/dL (ref 6–20)
CO2: 20 mmol/L — ABNORMAL LOW (ref 22–32)
Calcium: 8.7 mg/dL — ABNORMAL LOW (ref 8.9–10.3)
Chloride: 110 mmol/L (ref 98–111)
Creatinine, Ser: 0.9 mg/dL (ref 0.61–1.24)
GFR, Estimated: >60 mL/min (ref >60)
Glucose, Bld: 96 mg/dL (ref 70–99)
Potassium: 3.9 mmol/L (ref 3.5–5.1)
Sodium: 139 mmol/L (ref 135–145)
Total Bilirubin: 0.7 mg/dL (ref 0.3–1.2)
Total Protein: 6 g/dL — ABNORMAL LOW (ref 6.5–8.1)

## 2021-09-05 LAB — ETHANOL: Alcohol, Ethyl (B): 133 mg/dL — ABNORMAL HIGH (ref <10)

## 2021-09-05 NOTE — Discharge Instructions (Addendum)
Please f/u with outpatient psych regarding anxiety and depression. For chest pain and generalized weakness, please f/u with PCP. If chest pain worsens or changes, if new SOB, nausea, vomiting, and diaphoresis please return to emergency department for further evaluation.

## 2021-09-05 NOTE — ED Provider Notes (Signed)
[facility]-EMERGENCY DEPT Provider Note   CSN: [id number] Arrival date & time: 09/05/21  0433     History  Chief Complaint  Patient presents with   Anxiety   Depression    Theodore Hendrix is a 31 y.o. male with PMH of anxiety, depression and ADHD presents with chief concern of anxiety, chest pain, and generalized weakness. Patient reported at 6 pm last night, he was sitting on his porch and began to have an "anxiety attack" because his family was out of town and he was alone. Endorsed chest pain and generalize weakness that began shortly after the attack. He drank 24 ounces of beer "to help his nerves" but denies other substance use. The chest pain is both sharp and dull and radiates down both arms. Denies SOB and diaphoresis. Did vomit x1 but denies nausea. The generalized weakness started just after he drank the beer and has been constant but patient reports he is able to walk and move all his extremities. Denies SI/HI.    Anxiety Associated symptoms include chest pain. Pertinent negatives include no shortness of breath.  Depression Associated symptoms include chest pain. Pertinent negatives include no shortness of breath.       Home Medications Prior to Admission medications   Medication Sig Start Date End Date Taking? Authorizing Provider  amoxicillin-clavulanate (AUGMENTIN) 875-125 MG tablet Take 1 tablet by mouth 2 (two) times daily. 06/15/19   Bethel Born, PA-C  meloxicam (MOBIC) 15 MG tablet Take 1 tablet (15 mg total) by mouth daily. 06/15/19   Bethel Born, PA-C  oxyCODONE-acetaminophen (PERCOCET/ROXICET) 5-325 MG tablet Take 1 tablet by mouth every 4 (four) hours as needed for severe pain. 06/15/19   Bethel Born, PA-C      Allergies    Patient has no known allergies.    Review of Systems   Review of Systems  Constitutional:  Negative for diaphoresis.  Respiratory:  Negative for shortness of breath.   Cardiovascular:  Positive  for chest pain.  Gastrointestinal:  Positive for vomiting (x1 occurrence).  Neurological:  Positive for weakness.  Psychiatric/Behavioral:  Positive for depression. Negative for suicidal ideas. The patient is nervous/anxious.     Physical Exam Updated Vital Signs BP 130/85 (BP Location: Right Arm)   Pulse 71   Temp 97.6 F (36.4 C) (Oral)   Resp 18   Ht 6\' 2"  (1.88 m)   Wt 83 kg   SpO2 100%   BMI 23.50 kg/m  Physical Exam Constitutional:      Appearance: Normal appearance. He is normal weight.  HENT:     Head: Normocephalic and atraumatic.  Eyes:     Pupils: Pupils are equal, round, and reactive to light.  Cardiovascular:     Rate and Rhythm: Normal rate and regular rhythm.     Pulses: Normal pulses.     Heart sounds: Normal heart sounds. No murmur heard.    No friction rub. No gallop.  Pulmonary:     Effort: Pulmonary effort is normal.     Breath sounds: Normal breath sounds.  Chest:     Comments: Bilateral CP on palpation Abdominal:     General: Abdomen is flat. Bowel sounds are normal.     Palpations: Abdomen is soft.  Musculoskeletal:        General: Normal range of motion.  Skin:    General: Skin is warm and dry.     Capillary Refill: Capillary refill takes less than 2 seconds.  Neurological:  Mental Status: He is alert and oriented to person, place, and time.     Motor: Motor function is intact.     Coordination: Coordination is intact.     Gait: Gait is intact.  Psychiatric:        Mood and Affect: Affect normal. Mood is depressed.        Behavior: Behavior is cooperative.        Thought Content: Thought content normal.     ED Results / Procedures / Treatments   Labs (all labs ordered are listed, but only abnormal results are displayed) Labs Reviewed  CBC WITH DIFFERENTIAL/PLATELET - Abnormal; Notable for the following components:      Result Value   WBC 12.0 (*)    RBC 5.85 (*)    Lymphs Abs 5.5 (*)    Abs Immature Granulocytes 0.18 (*)     All other components within normal limits  COMPREHENSIVE METABOLIC PANEL - Abnormal; Notable for the following components:   CO2 20 (*)    Calcium 8.7 (*)    Total Protein 6.0 (*)    Alkaline Phosphatase 36 (*)    All other components within normal limits  ETHANOL - Abnormal; Notable for the following components:   Alcohol, Ethyl (B) 133 (*)    All other components within normal limits    EKG Per my read, EKG reveals NSR and rate 69 BPM.   Medications Ordered in ED Medications - No data to display  ED Course/ Medical Decision Making/ A&P                           Medical Decision Making [name] is a 31 yo male with PMH of anxiety, depression, and ADHD presents with chief concern of anxiety attack and associated CP and generalized weakness. Differential for chest pain is likely anxiety induced but cannot r/o ACS as etiology. PE reassuring with reproducible CP on exam and no evidence of JVD, new, murmur, gallop, or rub. But reports radiation of CP down arms so will order an EKG. Denies SOB, pneumothorax or pulm embolism is unlikely. Alcohol consumption and anxiety attack likely etiology for generalized weakness given onset of weakness just after consumption. Cannot r/o dehydration. Will encourage PO intake. Neuro exam reassuring. Will recommend f/u psych eval outpatient.   Amount and/or Complexity of Data Reviewed External Data Reviewed: notes. Labs: ordered. Decision-making details documented in ED Course. ECG/medicine tests: ordered. Decision-making details documented in ED Course.           Final Clinical Impression(s) / ED Diagnoses Final diagnoses:  Anxiety    Rx / DC Orders ED Discharge Orders     None         Theodore Eagle, PA-C 09/05/21 1215    Theodore Munch, MD 09/06/21 [phone]

## 2021-09-05 NOTE — ED Triage Notes (Addendum)
Pt BIB EMS with reports of depression and anxiety. Pt states that he has been sitting on his front porch since 6 pm yesterday. Pt states that his whole body is cramping. He had a gatorade and water yesterday but not much to eat. Pt smells like alcohol. Pt denies alcohol use.

## 2021-10-21 ENCOUNTER — Encounter (HOSPITAL_COMMUNITY): Payer: Self-pay | Admitting: *Deleted

## 2021-10-21 ENCOUNTER — Other Ambulatory Visit: Payer: Self-pay

## 2021-10-21 ENCOUNTER — Emergency Department (HOSPITAL_COMMUNITY)
Admission: EM | Admit: 2021-10-21 | Discharge: 2021-10-22 | Disposition: A | Discharge status: Home / Self Care | Payer: Self-pay | Attending: Emergency Medicine | Admitting: Emergency Medicine

## 2021-10-21 DIAGNOSIS — R944 Abnormal results of kidney function studies: Secondary | ICD-10-CM | POA: Insufficient documentation

## 2021-10-21 DIAGNOSIS — R531 Weakness: Secondary | ICD-10-CM | POA: Insufficient documentation

## 2021-10-21 DIAGNOSIS — Z5900 Homelessness unspecified: Secondary | ICD-10-CM | POA: Insufficient documentation

## 2021-10-21 DIAGNOSIS — R400 Somnolence: Secondary | ICD-10-CM | POA: Insufficient documentation

## 2021-10-21 LAB — CBC
HCT: 42.9 % (ref 39.0–52.0)
Hemoglobin: 14.3 g/dL (ref 13.0–17.0)
MCH: 28.2 pg (ref 26.0–34.0)
MCHC: 33.3 g/dL (ref 30.0–36.0)
MCV: 84.6 fL (ref 80.0–100.0)
Platelets: 257 10*3/uL (ref 150–400)
RBC: 5.07 MIL/uL (ref 4.22–5.81)
RDW: 12.9 % (ref 11.5–15.5)
WBC: 9.7 10*3/uL (ref 4.0–10.5)
nRBC: 0 % (ref 0.0–0.2)

## 2021-10-21 LAB — COMPREHENSIVE METABOLIC PANEL
ALT: 19 U/L (ref 0–44)
AST: 21 U/L (ref 15–41)
Albumin: 3.9 g/dL (ref 3.5–5.0)
Alkaline Phosphatase: 50 U/L (ref 38–126)
Anion gap: 11 (ref 5–15)
BUN: 13 mg/dL (ref 6–20)
CO2: 23 mmol/L (ref 22–32)
Calcium: 9.3 mg/dL (ref 8.9–10.3)
Chloride: 110 mmol/L (ref 98–111)
Creatinine, Ser: 1.27 mg/dL — ABNORMAL HIGH (ref 0.61–1.24)
GFR, Estimated: >60 mL/min (ref >60)
Glucose, Bld: 88 mg/dL (ref 70–99)
Potassium: 4.2 mmol/L (ref 3.5–5.1)
Sodium: 144 mmol/L (ref 135–145)
Total Bilirubin: 0.6 mg/dL (ref 0.3–1.2)
Total Protein: 6.4 g/dL — ABNORMAL LOW (ref 6.5–8.1)

## 2021-10-21 MED ORDER — KETOROLAC TROMETHAMINE 15 MG/ML IJ SOLN
15.0000 mg | Freq: Once | INTRAMUSCULAR | Status: AC
  Administered 2021-10-21: 15 mg via INTRAVENOUS
  Filled 2021-10-21: qty 1

## 2021-10-21 MED ORDER — SODIUM CHLORIDE 0.9 % IV BOLUS
1000.0000 mL | Freq: Once | INTRAVENOUS | Status: AC
  Administered 2021-10-21: 1000 mL via INTRAVENOUS

## 2021-10-21 NOTE — Discharge Instructions (Addendum)
You have been seen today for your complaint of weakness. Your lab work was reassuring and showed no abnormalities. Home care instructions are as follows:  You should see the resource list that I have printed off for you.  This is a list of resources for you such as homeless shelters, social services, and food pantries. Follow up with: Your primary care provider in 1 week. Please seek immediate medical care if you develop any of the following symptoms: You have sudden weakness on one side of your face or body. You have chest pain. You have trouble breathing or shortness of breath. You have problems with how you see (vision). You have trouble talking or swallowing. You have trouble standing or walking. You are light-headed or faint. At this time there does not appear to be the presence of an emergent medical condition, however there is always the potential for conditions to change. Please read and follow the below instructions.  Do not take your medicine if  develop an itchy rash, swelling in your mouth or lips, or difficulty breathing; call 911 and seek immediate emergency medical attention if this occurs.  You may review your lab tests and imaging results in their entirety on your MyChart account.  Please discuss all results of fully with your primary care provider and other specialist at your follow-up visit.  Note: Portions of this text may have been transcribed using voice recognition software. Every effort was made to ensure accuracy; however, inadvertent computerized transcription errors may still be present.

## 2021-10-21 NOTE — ED Provider Notes (Signed)
[facility] EMERGENCY DEPARTMENT Provider Note   CSN: [id number] Arrival date & time: 10/21/21  1803     History  Chief Complaint  Patient presents with   Weakness    Theodore Hendrix is a 31 y.o. male.  With a past medical history of ADHD, marijuana abuse.  Patient is currently homeless.  Reports he has had weakness for the past month.  Patient has been ambulatory each day over the past month.  No falls.  Patient was at the bus stop today just prior to arrival when he felt significantly more weak, states that the security guard noticed this and called EMS. Per EMS he has been picked up multiple times at this bus stop in the past for various complaints.  Patient states he has not eaten or drank anything in the past 3 days because he is homeless and jobless and cannot afford food. Reports smoking one "black" per day, denies alcohol, drug use. Denies fevers, chills, abdominal pain, nausea, vomiting, dizziness, light headedness, dysuria.    Weakness Associated symptoms: no fever        Home Medications Prior to Admission medications   Medication Sig Start Date End Date Taking? Authorizing Provider  amoxicillin-clavulanate (AUGMENTIN) 875-125 MG tablet Take 1 tablet by mouth 2 (two) times daily. 06/15/19   Recardo Evangelist, PA-C  meloxicam (MOBIC) 15 MG tablet Take 1 tablet (15 mg total) by mouth daily. 06/15/19   Recardo Evangelist, PA-C  oxyCODONE-acetaminophen (PERCOCET/ROXICET) 5-325 MG tablet Take 1 tablet by mouth every 4 (four) hours as needed for severe pain. 06/15/19   Recardo Evangelist, PA-C      Allergies    Patient has no known allergies.    Review of Systems   Review of Systems  Constitutional:  Positive for fatigue. Negative for chills and fever.  Neurological:  Positive for weakness.  All other systems reviewed and are negative.   Physical Exam Updated Vital Signs BP 129/85 (BP Location: Right Arm)   Pulse 73   Temp 99 F (37.2 C) (Oral)   Resp  20   Ht 2\' 6"  (0.762 m)   Wt 83 kg   SpO2 99%   BMI 142.94 kg/m  Physical Exam Vitals and nursing note reviewed.  Constitutional:      General: He is not in acute distress.    Appearance: Normal appearance. He is well-developed. He is not ill-appearing.     Comments: Patient appears slightly drowsy and has injected sclera bilaterally, but is otherwise well-appearing  HENT:     Head: Normocephalic and atraumatic.     Mouth/Throat:     Mouth: Mucous membranes are moist.     Pharynx: Oropharynx is clear. No oropharyngeal exudate or posterior oropharyngeal erythema.  Eyes:     Extraocular Movements: Extraocular movements intact.     Conjunctiva/sclera:     Right eye: Right conjunctiva is injected.     Left eye: Left conjunctiva is injected.     Pupils: Pupils are equal, round, and reactive to light.  Cardiovascular:     Rate and Rhythm: Normal rate and regular rhythm.     Pulses: Normal pulses.          Radial pulses are 2+ on the right side and 2+ on the left side.       Dorsalis pedis pulses are 2+ on the right side and 2+ on the left side.     Heart sounds: Normal heart sounds. No murmur heard. Pulmonary:  Effort: Pulmonary effort is normal. No respiratory distress.     Breath sounds: Normal breath sounds.  Abdominal:     General: Abdomen is flat.     Palpations: Abdomen is soft.     Tenderness: There is no abdominal tenderness.  Musculoskeletal:        General: No swelling.     Cervical back: Neck supple.  Skin:    General: Skin is warm and dry.     Capillary Refill: Capillary refill takes less than 2 seconds.  Neurological:     General: No focal deficit present.     Mental Status: He is alert and oriented to person, place, and time.     Motor: No weakness.     Comments: No pronator drift, no facial asymmetry, no unilateral or global weakness, strength 5 out of 5 globally  Psychiatric:        Mood and Affect: Mood normal.     ED Results / Procedures / Treatments    Labs (all labs ordered are listed, but only abnormal results are displayed) Labs Reviewed  COMPREHENSIVE METABOLIC PANEL - Abnormal; Notable for the following components:      Result Value   Creatinine, Ser 1.27 (*)    Total Protein 6.4 (*)    All other components within normal limits  CBC  URINALYSIS, ROUTINE W REFLEX MICROSCOPIC    EKG None  Radiology No results found.  Procedures Procedures    Medications Ordered in ED Medications  sodium chloride 0.9 % bolus 1,000 mL (has no administration in time range)    ED Course/ Medical Decision Making/ A&P Clinical Course as of 10/22/21 0002  Sat Oct 21, 2021  2328 ED EKG EKG shows NSR, no STEMI [AS]  Sun Oct 22, 2021  0001 Upon reevaluation, patient states that his headache and weakness are improving. [AS]    Clinical Course User Index [AS] Lula Olszewski Edsel Petrin, PA-C                           Medical Decision Making This patient presents to the ED for concern of weakness, this involves an extensive number of treatment options, and is a complaint that carries with it a high risk of complications and morbidity. The differential diagnosis of weakness includes but is not limited to neurologic causes (GBS, myasthenia gravis, CVA, MS, ALS, transverse myelitis, spinal cord injury, CVA, botulism, ) and other causes: ACS, Arrhythmia, syncope, orthostatic hypotension, sepsis, hypoglycemia, electrolyte disturbance, hypothyroidism, respiratory failure, symptomatic anemia, dehydration, heat injury, polypharmacy, malignancy, malnutrition    Co morbidities that complicate the patient evaluation  ADHD, marijuana abuse  My initial workup includes basic labs, ekg, fluids, food  Additional history obtained from: Nursing notes from this visit. Prior ED visit on 09/05/2021 for evaluation of anxiety Previous records within EMR system showing multiple ED visits for various complaints over the past 3 years EMS who provided a portion of the  history  I ordered, reviewed and interpreted labs which include: CBC, CMP, urinalysis.  CMP showed creatinine slightly elevated to 1.27, patient reports not drinking fluids over the last 3 days.  This will be treated with IV fluids today.  Afebrile, hemodynamically stable.  Labs within normal limits except slightly elevated creatinine of 1.27.  Patient reports not eating or drinking anything for the past 3 days, I believe this is the most likely cause of his weakness today.  Patient was treated with 1 L normal saline and  was given food while in the ED. patient also began complaining of headache while in the ED. describes it as a throbbing pain in a bandlike distribution across his forehead.  This was treated with IV Toradol.  He reports improvement in his symptoms after these interventions.  Patient was also given a list of resources including homeless shelters, food pantries and social services with his discharge paperwork.  Stable at the time of discharge.   At this time there does not appear to be any evidence of an acute emergency medical condition and the patient appears stable for discharge with appropriate outpatient follow up. Diagnosis was discussed with patient who verbalizes understanding of care plan and is agreeable to discharge. I have discussed return precautions with patient who verbalizes understanding. Patient encouraged to follow-up with their PCP within 1 week. All questions answered.  Note: Portions of this report may have been transcribed using voice recognition software. Every effort was made to ensure accuracy; however, inadvertent computerized transcription errors may still be present.          Final Clinical Impression(s) / ED Diagnoses Final diagnoses:  None    Rx / DC Orders ED Discharge Orders     None         Michelle Piper, Cordelia Poche 10/22/21 0003    Rondel Baton, MD 10/22/21 [id number]

## 2021-10-21 NOTE — ED Triage Notes (Signed)
[place]  he is known by gems usually picked up at  the bus station  maybe homeless  he reports that he has weakness  for 2 days

## 2021-12-01 ENCOUNTER — Emergency Department (HOSPITAL_COMMUNITY): Payer: Self-pay

## 2021-12-01 ENCOUNTER — Encounter (HOSPITAL_COMMUNITY): Payer: Self-pay

## 2021-12-01 ENCOUNTER — Inpatient Hospital Stay (HOSPITAL_COMMUNITY)
Admission: EM | Admit: 2021-12-01 | Discharge: 2021-12-04 | DRG: 896 | Disposition: A | Discharge status: Home / Self Care | Payer: Self-pay | Attending: Surgery | Admitting: Surgery

## 2021-12-01 DIAGNOSIS — M25532 Pain in left wrist: Secondary | ICD-10-CM | POA: Diagnosis present

## 2021-12-01 DIAGNOSIS — Z23 Encounter for immunization: Secondary | ICD-10-CM

## 2021-12-01 DIAGNOSIS — R402432 Glasgow coma scale score 3-8, at arrival to emergency department: Secondary | ICD-10-CM

## 2021-12-01 DIAGNOSIS — J9601 Acute respiratory failure with hypoxia: Secondary | ICD-10-CM | POA: Diagnosis present

## 2021-12-01 DIAGNOSIS — F14129 Cocaine abuse with intoxication, unspecified: Principal | ICD-10-CM | POA: Diagnosis present

## 2021-12-01 DIAGNOSIS — F12229 Cannabis dependence with intoxication, unspecified: Secondary | ICD-10-CM | POA: Diagnosis present

## 2021-12-01 DIAGNOSIS — S0990XA Unspecified injury of head, initial encounter: Secondary | ICD-10-CM | POA: Diagnosis present

## 2021-12-01 DIAGNOSIS — Z9911 Dependence on respirator [ventilator] status: Secondary | ICD-10-CM

## 2021-12-01 DIAGNOSIS — F10129 Alcohol abuse with intoxication, unspecified: Secondary | ICD-10-CM | POA: Diagnosis present

## 2021-12-01 DIAGNOSIS — Z7151 Drug abuse counseling and surveillance of drug abuser: Secondary | ICD-10-CM

## 2021-12-01 LAB — COMPREHENSIVE METABOLIC PANEL
ALT: 75 U/L — ABNORMAL HIGH (ref 0–44)
AST: 76 U/L — ABNORMAL HIGH (ref 15–41)
Albumin: 4 g/dL (ref 3.5–5.0)
Alkaline Phosphatase: 52 U/L (ref 38–126)
Anion gap: 12 (ref 5–15)
BUN: 19 mg/dL (ref 6–20)
CO2: 22 mmol/L (ref 22–32)
Calcium: 9.1 mg/dL (ref 8.9–10.3)
Chloride: 109 mmol/L (ref 98–111)
Creatinine, Ser: 1.13 mg/dL (ref 0.61–1.24)
GFR, Estimated: 44 mL/min — ABNORMAL LOW (ref >60)
Glucose, Bld: 90 mg/dL (ref 70–99)
Potassium: 4.1 mmol/L (ref 3.5–5.1)
Sodium: 143 mmol/L (ref 135–145)
Total Bilirubin: 0.6 mg/dL (ref 0.3–1.2)
Total Protein: 6.6 g/dL (ref 6.5–8.1)

## 2021-12-01 LAB — I-STAT ARTERIAL BLOOD GAS, ED
Acid-base deficit: 2 mmol/L (ref 0.0–2.0)
Bicarbonate: 24.1 mmol/L (ref 20.0–28.0)
Calcium, Ion: 1.12 mmol/L — ABNORMAL LOW (ref 1.15–1.40)
HCT: 35 % — ABNORMAL LOW (ref 39.0–52.0)
Hemoglobin: 11.9 g/dL — ABNORMAL LOW (ref 13.0–17.0)
O2 Saturation: 97 %
Potassium: 3.8 mmol/L (ref 3.5–5.1)
Sodium: 139 mmol/L (ref 135–145)
TCO2: 26 mmol/L (ref 22–32)
pCO2 arterial: 47.4 mmHg (ref 32–48)
pH, Arterial: 7.314 — ABNORMAL LOW (ref 7.35–7.45)
pO2, Arterial: 101 mmHg (ref 83–108)

## 2021-12-01 LAB — URINALYSIS, ROUTINE W REFLEX MICROSCOPIC
Bilirubin Urine: NEGATIVE
Glucose, UA: NEGATIVE mg/dL
Hgb urine dipstick: NEGATIVE
Ketones, ur: NEGATIVE mg/dL
Leukocytes,Ua: NEGATIVE
Nitrite: NEGATIVE
Protein, ur: NEGATIVE mg/dL
Specific Gravity, Urine: >1.046 — ABNORMAL HIGH (ref 1.005–1.030)
pH: 5 (ref 5.0–8.0)

## 2021-12-01 LAB — I-STAT CHEM 8, ED
BUN: 21 mg/dL (ref 8–23)
Calcium, Ion: 1.1 mmol/L — ABNORMAL LOW (ref 1.15–1.40)
Chloride: 106 mmol/L (ref 98–111)
Creatinine, Ser: 1.5 mg/dL — ABNORMAL HIGH (ref 0.61–1.24)
Glucose, Bld: 92 mg/dL (ref 70–99)
HCT: 45 % (ref 39.0–52.0)
Hemoglobin: 15.3 g/dL (ref 13.0–17.0)
Potassium: 3.8 mmol/L (ref 3.5–5.1)
Sodium: 144 mmol/L (ref 135–145)
TCO2: 26 mmol/L (ref 22–32)

## 2021-12-01 LAB — CBC
HCT: 41.9 % (ref 39.0–52.0)
Hemoglobin: 14.5 g/dL (ref 13.0–17.0)
MCH: 28.8 pg (ref 26.0–34.0)
MCHC: 34.6 g/dL (ref 30.0–36.0)
MCV: 83.3 fL (ref 80.0–100.0)
Platelets: 184 10*3/uL (ref 150–400)
RBC: 5.03 MIL/uL (ref 4.22–5.81)
RDW: 13.7 % (ref 11.5–15.5)
WBC: 9 10*3/uL (ref 4.0–10.5)
nRBC: 0 % (ref 0.0–0.2)

## 2021-12-01 LAB — RAPID URINE DRUG SCREEN, HOSP PERFORMED
Amphetamines: NOT DETECTED
Barbiturates: NOT DETECTED
Benzodiazepines: NOT DETECTED
Cocaine: POSITIVE — AB
Opiates: NOT DETECTED
Tetrahydrocannabinol: POSITIVE — AB

## 2021-12-01 LAB — POCT I-STAT, CHEM 8
BUN: 21 mg/dL — ABNORMAL HIGH (ref 6–20)
Calcium, Ion: 1.1 mmol/L — ABNORMAL LOW (ref 1.15–1.40)
Chloride: 106 mmol/L (ref 98–111)
Creatinine, Ser: 1.5 mg/dL — ABNORMAL HIGH (ref 0.61–1.24)
Glucose, Bld: 92 mg/dL (ref 70–99)
HCT: 45 % (ref 39.0–52.0)
Hemoglobin: 15.3 g/dL (ref 13.0–17.0)
Potassium: 3.8 mmol/L (ref 3.5–5.1)
Sodium: 144 mmol/L (ref 135–145)
TCO2: 26 mmol/L (ref 22–32)

## 2021-12-01 LAB — SAMPLE TO BLOOD BANK

## 2021-12-01 LAB — PROTIME-INR
INR: 0.9 (ref 0.8–1.2)
Prothrombin Time: 11.6 seconds (ref 11.4–15.2)

## 2021-12-01 LAB — LACTIC ACID, PLASMA: Lactic Acid, Venous: 2.3 mmol/L (ref 0.5–1.9)

## 2021-12-01 LAB — ETHANOL: Alcohol, Ethyl (B): 286 mg/dL — ABNORMAL HIGH (ref <10)

## 2021-12-01 MED ORDER — PROPOFOL 1000 MG/100ML IV EMUL
INTRAVENOUS | Status: AC | PRN
  Administered 2021-12-01: 20 ug/kg/min via INTRAVENOUS

## 2021-12-01 MED ORDER — TETANUS-DIPHTH-ACELL PERTUSSIS 5-2.5-18.5 LF-MCG/0.5 IM SUSY
0.5000 mL | PREFILLED_SYRINGE | Freq: Once | INTRAMUSCULAR | Status: AC
  Administered 2021-12-01: 0.5 mL via INTRAMUSCULAR

## 2021-12-01 MED ORDER — ORAL CARE MOUTH RINSE
15.0000 mL | OROMUCOSAL | Status: DC
  Administered 2021-12-02 (×8): 15 mL via OROMUCOSAL

## 2021-12-01 MED ORDER — DOCUSATE SODIUM 50 MG/5ML PO LIQD
100.0000 mg | Freq: Two times a day (BID) | ORAL | Status: DC
  Administered 2021-12-02 (×2): 100 mg
  Filled 2021-12-01 (×2): qty 10

## 2021-12-01 MED ORDER — PROPOFOL 1000 MG/100ML IV EMUL
0.0000 ug/kg/min | INTRAVENOUS | Status: DC
  Administered 2021-12-01: 20 ug/kg/min via INTRAVENOUS
  Administered 2021-12-02: 30 ug/kg/min via INTRAVENOUS

## 2021-12-01 MED ORDER — FENTANYL 2500MCG IN NS 250ML (10MCG/ML) PREMIX INFUSION
50.0000 ug/h | INTRAVENOUS | Status: DC
  Administered 2021-12-01: 200 ug/h via INTRAVENOUS
  Filled 2021-12-01: qty 250

## 2021-12-01 MED ORDER — FENTANYL CITRATE PF 50 MCG/ML IJ SOSY
50.0000 ug | PREFILLED_SYRINGE | INTRAMUSCULAR | Status: DC | PRN
  Administered 2021-12-03: 50 ug via INTRAVENOUS

## 2021-12-01 MED ORDER — PROCHLORPERAZINE EDISYLATE 10 MG/2ML IJ SOLN: 10.0000 mg | INTRAMUSCULAR | Status: DC | PRN

## 2021-12-01 MED ORDER — ONDANSETRON HCL 4 MG/2ML IJ SOLN
4.0000 mg | Freq: Four times a day (QID) | INTRAMUSCULAR | Status: DC | PRN
  Administered 2021-12-02 – 2021-12-03 (×2): 4 mg via INTRAVENOUS
  Filled 2021-12-01 (×2): qty 2

## 2021-12-01 MED ORDER — ETOMIDATE 2 MG/ML IV SOLN
INTRAVENOUS | Status: AC | PRN
  Administered 2021-12-01: 20 mg via INTRAVENOUS

## 2021-12-01 MED ORDER — NALOXONE HCL 0.4 MG/ML IJ SOLN
0.4000 mg | Freq: Once | INTRAMUSCULAR | Status: AC
  Administered 2021-12-01: 0.4 mg via INTRAVENOUS

## 2021-12-01 MED ORDER — MIDAZOLAM HCL 2 MG/2ML IJ SOLN: 2.0000 mg | INTRAMUSCULAR | Status: DC | PRN

## 2021-12-01 MED ORDER — FENTANYL CITRATE PF 50 MCG/ML IJ SOSY
50.0000 ug | PREFILLED_SYRINGE | INTRAMUSCULAR | Status: DC | PRN
  Filled 2021-12-01: qty 1

## 2021-12-01 MED ORDER — IOHEXOL 350 MG/ML SOLN
75.0000 mL | Freq: Once | INTRAVENOUS | Status: AC | PRN
  Administered 2021-12-01: 75 mL via INTRAVENOUS

## 2021-12-01 MED ORDER — LACTATED RINGERS IV SOLN: INTRAVENOUS | Status: DC

## 2021-12-01 MED ORDER — OXYCODONE HCL 5 MG/5ML PO SOLN
10.0000 mg | ORAL | Status: DC | PRN
  Administered 2021-12-02: 10 mg
  Filled 2021-12-01: qty 10

## 2021-12-01 MED ORDER — ORAL CARE MOUTH RINSE: 15.0000 mL | OROMUCOSAL | Status: DC | PRN

## 2021-12-01 MED ORDER — FENTANYL CITRATE PF 50 MCG/ML IJ SOSY
50.0000 ug | PREFILLED_SYRINGE | Freq: Once | INTRAMUSCULAR | Status: AC
  Administered 2021-12-01: 100 ug via INTRAVENOUS

## 2021-12-01 MED ORDER — POLYETHYLENE GLYCOL 3350 17 G PO PACK
17.0000 g | PACK | Freq: Every day | ORAL | Status: DC
  Administered 2021-12-02: 17 g
  Filled 2021-12-01: qty 1

## 2021-12-01 MED ORDER — FENTANYL BOLUS VIA INFUSION: 50.0000 ug | INTRAVENOUS | Status: DC | PRN

## 2021-12-01 MED ORDER — SUCCINYLCHOLINE CHLORIDE 20 MG/ML IJ SOLN
INTRAMUSCULAR | Status: AC | PRN
  Administered 2021-12-01: 120 mg via INTRAVENOUS

## 2021-12-01 MED ORDER — TETANUS-DIPHTH-ACELL PERTUSSIS 5-2.5-18.5 LF-MCG/0.5 IM SUSY
PREFILLED_SYRINGE | INTRAMUSCULAR | Status: AC
  Filled 2021-12-01: qty 0.5

## 2021-12-01 MED ORDER — SODIUM CHLORIDE 0.9 % IV SOLN
INTRAVENOUS | Status: AC | PRN
  Administered 2021-12-01: 1000 mL via INTRAVENOUS
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
## 2021-12-01 NOTE — ED Notes (Signed)
Pt intubated by Dr. Donald Pore, 25 at the lip, 7.5 ETT
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
## 2021-12-01 NOTE — ED Notes (Signed)
Pt log rolled

## 2021-12-01 NOTE — ED Notes (Signed)
[place] j collar placed by ryan RN

## 2021-12-01 NOTE — ED Notes (Signed)
Patient transported to CT 
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
## 2021-12-01 NOTE — Progress Notes (Signed)
PT transported to CT and back with RT, RN, and NT. No distress noted. PT suctioned pre/post transport
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
## 2021-12-02 LAB — CBC
HCT: 37.9 % — ABNORMAL LOW (ref 39.0–52.0)
Hemoglobin: 12.8 g/dL — ABNORMAL LOW (ref 13.0–17.0)
MCH: 28.1 pg (ref 26.0–34.0)
MCHC: 33.8 g/dL (ref 30.0–36.0)
MCV: 83.1 fL (ref 80.0–100.0)
Platelets: 170 10*3/uL (ref 150–400)
RBC: 4.56 MIL/uL (ref 4.22–5.81)
RDW: 13.6 % (ref 11.5–15.5)
WBC: 10.5 10*3/uL (ref 4.0–10.5)
nRBC: 0 % (ref 0.0–0.2)

## 2021-12-02 LAB — BASIC METABOLIC PANEL
Anion gap: 9 (ref 5–15)
BUN: 18 mg/dL (ref 6–20)
CO2: 23 mmol/L (ref 22–32)
Calcium: 8.2 mg/dL — ABNORMAL LOW (ref 8.9–10.3)
Chloride: 112 mmol/L — ABNORMAL HIGH (ref 98–111)
Creatinine, Ser: 1.1 mg/dL (ref 0.61–1.24)
GFR, Estimated: >60 mL/min (ref >60)
Glucose, Bld: 94 mg/dL (ref 70–99)
Potassium: 3.7 mmol/L (ref 3.5–5.1)
Sodium: 144 mmol/L (ref 135–145)

## 2021-12-02 LAB — MRSA NEXT GEN BY PCR, NASAL: MRSA by PCR Next Gen: NOT DETECTED

## 2021-12-02 LAB — TRIGLYCERIDES: Triglycerides: 180 mg/dL — ABNORMAL HIGH (ref <150)

## 2021-12-02 LAB — GLUCOSE, CAPILLARY: Glucose-Capillary: 135 mg/dL — ABNORMAL HIGH (ref 70–99)

## 2021-12-02 MED ORDER — DEXMEDETOMIDINE HCL IN NACL 400 MCG/100ML IV SOLN
0.2000 ug/kg/h | INTRAVENOUS | Status: DC
  Administered 2021-12-02: 0.2 ug/kg/h via INTRAVENOUS
  Filled 2021-12-02: qty 100

## 2021-12-02 MED ORDER — ENOXAPARIN SODIUM 30 MG/0.3ML IJ SOSY
30.0000 mg | PREFILLED_SYRINGE | Freq: Two times a day (BID) | INTRAMUSCULAR | Status: DC
  Administered 2021-12-02 – 2021-12-03 (×4): 30 mg via SUBCUTANEOUS
  Filled 2021-12-02 (×5): qty 0.3

## 2021-12-02 MED ORDER — FOLIC ACID 1 MG PO TABS
1.0000 mg | ORAL_TABLET | Freq: Every day | ORAL | Status: DC
  Administered 2021-12-02: 1 mg
  Filled 2021-12-02: qty 1

## 2021-12-02 MED ORDER — CHLORHEXIDINE GLUCONATE CLOTH 2 % EX PADS
6.0000 | MEDICATED_PAD | Freq: Every day | CUTANEOUS | Status: DC
  Administered 2021-12-02: 6 via TOPICAL

## 2021-12-02 MED ORDER — ADULT MULTIVITAMIN LIQUID CH
15.0000 mL | Freq: Every day | ORAL | Status: DC
  Administered 2021-12-02: 15 mL
  Filled 2021-12-02 (×2): qty 15

## 2021-12-02 MED ORDER — THIAMINE MONONITRATE 100 MG PO TABS
100.0000 mg | ORAL_TABLET | Freq: Every day | ORAL | Status: DC
  Administered 2021-12-02: 100 mg
  Filled 2021-12-02: qty 1

## 2021-12-02 NOTE — Procedures (Signed)
Extubation Procedure Note  Patient Details:   Name: Theodore Hendrix DOB: 23-Jun-1990 MRN: [id number]   Airway Documentation:    Vent end date: (not recorded) Vent end time: (not recorded)   Evaluation  O2 sats: stable throughout and currently acceptable Complications: No apparent complications Patient did tolerate procedure well. Bilateral Breath Sounds: Clear, Diminished   Yes  Positive cuff leak heard, pt able to lift head off pillow and stick out tongue prior to extubation. Pt placed on 4L [place], VS within normal limits. Pt vomited a large amount of bile post extubation. Pt encouraged to use Y[place]auer to clear secretions. Diminished BS, no stridor heard post extubation.   Jesse Sans 12/02/2021, 3:19 PM

## 2021-12-02 NOTE — Progress Notes (Signed)
Spoke to trauma MD White. Orders given to wean and extubate. All sedation was turned off. RT notified. Will continue to monitor.
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
## 2021-12-03 ENCOUNTER — Inpatient Hospital Stay (HOSPITAL_COMMUNITY): Payer: Self-pay

## 2021-12-03 LAB — BASIC METABOLIC PANEL
Anion gap: 7 (ref 5–15)
BUN: 16 mg/dL (ref 6–20)
CO2: 25 mmol/L (ref 22–32)
Calcium: 8.1 mg/dL — ABNORMAL LOW (ref 8.9–10.3)
Chloride: 103 mmol/L (ref 98–111)
Creatinine, Ser: 1.08 mg/dL (ref 0.61–1.24)
GFR, Estimated: >60 mL/min (ref >60)
Glucose, Bld: 104 mg/dL — ABNORMAL HIGH (ref 70–99)
Potassium: 3.4 mmol/L — ABNORMAL LOW (ref 3.5–5.1)
Sodium: 135 mmol/L (ref 135–145)

## 2021-12-03 LAB — CBC
HCT: 37.1 % — ABNORMAL LOW (ref 39.0–52.0)
Hemoglobin: 12.4 g/dL — ABNORMAL LOW (ref 13.0–17.0)
MCH: 27.7 pg (ref 26.0–34.0)
MCHC: 33.4 g/dL (ref 30.0–36.0)
MCV: 83 fL (ref 80.0–100.0)
Platelets: 145 10*3/uL — ABNORMAL LOW (ref 150–400)
RBC: 4.47 MIL/uL (ref 4.22–5.81)
RDW: 13.3 % (ref 11.5–15.5)
WBC: 9 10*3/uL (ref 4.0–10.5)
nRBC: 0 % (ref 0.0–0.2)

## 2021-12-03 MED ORDER — CHLORHEXIDINE GLUCONATE CLOTH 2 % EX PADS
6.0000 | MEDICATED_PAD | Freq: Every day | CUTANEOUS | Status: DC
  Administered 2021-12-03: 6 via TOPICAL

## 2021-12-03 MED ORDER — ACETAMINOPHEN 325 MG PO TABS
650.0000 mg | ORAL_TABLET | Freq: Four times a day (QID) | ORAL | Status: DC
  Administered 2021-12-03 – 2021-12-04 (×4): 650 mg via ORAL
  Filled 2021-12-03 (×4): qty 2

## 2021-12-03 MED ORDER — DOCUSATE SODIUM 50 MG/5ML PO LIQD
100.0000 mg | Freq: Two times a day (BID) | ORAL | Status: DC
  Filled 2021-12-03: qty 10

## 2021-12-03 MED ORDER — IBUPROFEN 200 MG PO TABS
600.0000 mg | ORAL_TABLET | Freq: Four times a day (QID) | ORAL | Status: DC | PRN
  Administered 2021-12-03 (×2): 600 mg via ORAL
  Filled 2021-12-03 (×2): qty 3

## 2021-12-03 MED ORDER — TRAMADOL HCL 50 MG PO TABS
50.0000 mg | ORAL_TABLET | Freq: Four times a day (QID) | ORAL | Status: DC | PRN
  Administered 2021-12-03 (×2): 50 mg via ORAL
  Filled 2021-12-03 (×2): qty 1

## 2021-12-03 MED ORDER — ADULT MULTIVITAMIN LIQUID CH: 15.0000 mL | Freq: Every day | ORAL | Status: DC

## 2021-12-03 MED ORDER — DOCUSATE SODIUM 100 MG PO CAPS
100.0000 mg | ORAL_CAPSULE | Freq: Two times a day (BID) | ORAL | Status: DC
  Administered 2021-12-03 – 2021-12-04 (×2): 100 mg via ORAL
  Filled 2021-12-03 (×2): qty 1

## 2021-12-03 MED ORDER — ADULT MULTIVITAMIN W/MINERALS CH
1.0000 | ORAL_TABLET | Freq: Every day | ORAL | Status: DC
  Administered 2021-12-03 – 2021-12-04 (×2): 1 via ORAL
  Filled 2021-12-03 (×2): qty 1

## 2021-12-03 MED ORDER — THIAMINE MONONITRATE 100 MG PO TABS
100.0000 mg | ORAL_TABLET | Freq: Every day | ORAL | Status: DC
  Administered 2021-12-04: 100 mg via ORAL
  Filled 2021-12-03: qty 1

## 2021-12-03 MED ORDER — LORAZEPAM 1 MG PO TABS: 1.0000 mg | ORAL_TABLET | ORAL | Status: DC | PRN

## 2021-12-03 MED ORDER — HYDROMORPHONE HCL 1 MG/ML IJ SOLN: 0.5000 mg | INTRAMUSCULAR | Status: DC | PRN

## 2021-12-03 MED ORDER — FOLIC ACID 1 MG PO TABS
1.0000 mg | ORAL_TABLET | Freq: Every day | ORAL | Status: DC
  Administered 2021-12-04: 1 mg via ORAL
  Filled 2021-12-03: qty 1

## 2021-12-03 MED ORDER — LORAZEPAM 2 MG/ML IJ SOLN
1.0000 mg | INTRAMUSCULAR | Status: DC | PRN
  Administered 2021-12-03: 2 mg via INTRAVENOUS
  Filled 2021-12-03: qty 1
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
## 2021-12-04 ENCOUNTER — Encounter (HOSPITAL_COMMUNITY): Payer: Self-pay | Admitting: *Deleted

## 2021-12-04 LAB — CBC
HCT: 35.2 % — ABNORMAL LOW (ref 39.0–52.0)
Hemoglobin: 12.2 g/dL — ABNORMAL LOW (ref 13.0–17.0)
MCH: 28.5 pg (ref 26.0–34.0)
MCHC: 34.7 g/dL (ref 30.0–36.0)
MCV: 82.2 fL (ref 80.0–100.0)
Platelets: 143 10*3/uL — ABNORMAL LOW (ref 150–400)
RBC: 4.28 MIL/uL (ref 4.22–5.81)
RDW: 12.6 % (ref 11.5–15.5)
WBC: 9.1 10*3/uL (ref 4.0–10.5)
nRBC: 0 % (ref 0.0–0.2)

## 2021-12-04 LAB — BASIC METABOLIC PANEL
Anion gap: 8 (ref 5–15)
BUN: 14 mg/dL (ref 6–20)
CO2: 27 mmol/L (ref 22–32)
Calcium: 8.7 mg/dL — ABNORMAL LOW (ref 8.9–10.3)
Chloride: 99 mmol/L (ref 98–111)
Creatinine, Ser: 1.16 mg/dL (ref 0.61–1.24)
GFR, Estimated: >60 mL/min (ref >60)
Glucose, Bld: 115 mg/dL — ABNORMAL HIGH (ref 70–99)
Potassium: 3.4 mmol/L — ABNORMAL LOW (ref 3.5–5.1)
Sodium: 134 mmol/L — ABNORMAL LOW (ref 135–145)

## 2021-12-04 MED ORDER — POTASSIUM CHLORIDE CRYS ER 20 MEQ PO TBCR
40.0000 meq | EXTENDED_RELEASE_TABLET | Freq: Once | ORAL | Status: AC
  Administered 2021-12-04: 40 meq via ORAL
  Filled 2021-12-04: qty 2

## 2021-12-04 MED ORDER — ACETAMINOPHEN 325 MG PO TABS: 650.0000 mg | ORAL_TABLET | Freq: Four times a day (QID) | ORAL | 2 refills | Status: DC | PRN

## 2021-12-04 NOTE — Plan of Care (Signed)
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
## 2022-01-23 ENCOUNTER — Emergency Department (HOSPITAL_COMMUNITY)
Admission: EM | Admit: 2022-01-23 | Discharge: 2022-01-24 | Discharge status: Against Medical Advice | Payer: Self-pay | Attending: Emergency Medicine | Admitting: Emergency Medicine

## 2022-01-23 DIAGNOSIS — R0789 Other chest pain: Secondary | ICD-10-CM | POA: Insufficient documentation

## 2022-01-23 DIAGNOSIS — R42 Dizziness and giddiness: Secondary | ICD-10-CM | POA: Insufficient documentation

## 2022-01-23 DIAGNOSIS — Z5321 Procedure and treatment not carried out due to patient leaving prior to being seen by health care provider: Secondary | ICD-10-CM | POA: Insufficient documentation
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
## 2022-04-10 ENCOUNTER — Other Ambulatory Visit: Payer: Self-pay

## 2022-04-10 ENCOUNTER — Emergency Department (HOSPITAL_BASED_OUTPATIENT_CLINIC_OR_DEPARTMENT_OTHER): Payer: Self-pay

## 2022-04-10 ENCOUNTER — Emergency Department (HOSPITAL_BASED_OUTPATIENT_CLINIC_OR_DEPARTMENT_OTHER)
Admission: EM | Admit: 2022-04-10 | Discharge: 2022-04-11 | Disposition: A | Discharge status: Home / Self Care | Payer: Self-pay | Attending: Emergency Medicine | Admitting: Emergency Medicine

## 2022-04-10 DIAGNOSIS — R7989 Other specified abnormal findings of blood chemistry: Secondary | ICD-10-CM | POA: Insufficient documentation

## 2022-04-10 DIAGNOSIS — Z1152 Encounter for screening for COVID-19: Secondary | ICD-10-CM | POA: Insufficient documentation

## 2022-04-10 DIAGNOSIS — R112 Nausea with vomiting, unspecified: Secondary | ICD-10-CM

## 2022-04-10 DIAGNOSIS — D72829 Elevated white blood cell count, unspecified: Secondary | ICD-10-CM | POA: Insufficient documentation

## 2022-04-10 DIAGNOSIS — F10920 Alcohol use, unspecified with intoxication, uncomplicated: Secondary | ICD-10-CM

## 2022-04-10 DIAGNOSIS — F10120 Alcohol abuse with intoxication, uncomplicated: Secondary | ICD-10-CM | POA: Insufficient documentation

## 2022-04-10 DIAGNOSIS — Y906 Blood alcohol level of 120-199 mg/100 ml: Secondary | ICD-10-CM | POA: Insufficient documentation

## 2022-04-10 LAB — CBC WITH DIFFERENTIAL/PLATELET
Abs Immature Granulocytes: 0.04 10*3/uL (ref 0.00–0.07)
Basophils Absolute: 0 10*3/uL (ref 0.0–0.1)
Basophils Relative: 0 %
Eosinophils Absolute: 0 10*3/uL (ref 0.0–0.5)
Eosinophils Relative: 0 %
HCT: 44.2 % (ref 39.0–52.0)
Hemoglobin: 15 g/dL (ref 13.0–17.0)
Immature Granulocytes: 0 %
Lymphocytes Relative: 30 %
Lymphs Abs: 3.8 10*3/uL (ref 0.7–4.0)
MCH: 27 pg (ref 26.0–34.0)
MCHC: 33.9 g/dL (ref 30.0–36.0)
MCV: 79.6 fL — ABNORMAL LOW (ref 80.0–100.0)
Monocytes Absolute: 0.6 10*3/uL (ref 0.1–1.0)
Monocytes Relative: 5 %
Neutro Abs: 8.1 10*3/uL — ABNORMAL HIGH (ref 1.7–7.7)
Neutrophils Relative %: 65 %
Platelets: 268 10*3/uL (ref 150–400)
RBC: 5.55 MIL/uL (ref 4.22–5.81)
RDW: 13.2 % (ref 11.5–15.5)
WBC: 12.6 10*3/uL — ABNORMAL HIGH (ref 4.0–10.5)
nRBC: 0 % (ref 0.0–0.2)

## 2022-04-10 LAB — COMPREHENSIVE METABOLIC PANEL
ALT: 32 U/L (ref 0–44)
AST: 36 U/L (ref 15–41)
Albumin: 4 g/dL (ref 3.5–5.0)
Alkaline Phosphatase: 59 U/L (ref 38–126)
Anion gap: 10 (ref 5–15)
BUN: 15 mg/dL (ref 6–20)
CO2: 21 mmol/L — ABNORMAL LOW (ref 22–32)
Calcium: 8.3 mg/dL — ABNORMAL LOW (ref 8.9–10.3)
Chloride: 105 mmol/L (ref 98–111)
Creatinine, Ser: 1.25 mg/dL — ABNORMAL HIGH (ref 0.61–1.24)
GFR, Estimated: >60 mL/min (ref >60)
Glucose, Bld: 101 mg/dL — ABNORMAL HIGH (ref 70–99)
Potassium: 3.7 mmol/L (ref 3.5–5.1)
Sodium: 136 mmol/L (ref 135–145)
Total Bilirubin: 1.1 mg/dL (ref 0.3–1.2)
Total Protein: 7.2 g/dL (ref 6.5–8.1)

## 2022-04-10 LAB — RESP PANEL BY RT-PCR (RSV, FLU A&B, COVID)  RVPGX2
Influenza A by PCR: NEGATIVE
Influenza B by PCR: NEGATIVE
Resp Syncytial Virus by PCR: NEGATIVE
SARS Coronavirus 2 by RT PCR: NEGATIVE

## 2022-04-10 LAB — ETHANOL: Alcohol, Ethyl (B): 121 mg/dL — ABNORMAL HIGH (ref <10)

## 2022-04-10 LAB — RAPID URINE DRUG SCREEN, HOSP PERFORMED
Amphetamines: NOT DETECTED
Barbiturates: NOT DETECTED
Benzodiazepines: NOT DETECTED
Cocaine: POSITIVE — AB
Opiates: NOT DETECTED
Tetrahydrocannabinol: POSITIVE — AB

## 2022-04-10 LAB — TROPONIN I (HIGH SENSITIVITY): Troponin I (High Sensitivity): 3 ng/L (ref <18)

## 2022-04-10 LAB — LIPASE, BLOOD: Lipase: 27 U/L (ref 11–51)

## 2022-04-10 MED ORDER — FAMOTIDINE 20 MG PO TABS
20.0000 mg | ORAL_TABLET | Freq: Two times a day (BID) | ORAL | 0 refills | Status: DC
Start: 2022-04-10 — End: 2022-04-18

## 2022-04-10 MED ORDER — ONDANSETRON 4 MG PO TBDP
4.0000 mg | ORAL_TABLET | Freq: Once | ORAL | Status: AC
  Administered 2022-04-10: 4 mg via ORAL
  Filled 2022-04-10: qty 1

## 2022-04-10 MED ORDER — ONDANSETRON 4 MG PO TBDP: 4.0000 mg | ORAL_TABLET | Freq: Three times a day (TID) | ORAL | 0 refills | Status: DC | PRN

## 2022-04-10 MED ORDER — IBUPROFEN 800 MG PO TABS
800.0000 mg | ORAL_TABLET | Freq: Once | ORAL | Status: AC
  Administered 2022-04-10: 800 mg via ORAL
  Filled 2022-04-10: qty 1

## 2022-04-10 NOTE — Discharge Instructions (Signed)
Please read and follow all provided instructions.  Your diagnoses today include:  1. Alcoholic intoxication without complication ([place])   2. Nausea and vomiting, unspecified vomiting type     TTests performed today include: Blood cell counts and platelets: Mildly elevated white blood cell count Kidney and liver function tests: Mildly elevated creatinine Pancreas function test (called lipase): Was normal Alcohol level was elevated Vital signs. See below for your results today.   Medications prescribed:  Pepcid (famotidine) - antihistamine  You can find this medication over-the-counter.   DO NOT exceed:  38m Pepcid every 12 hours  Zofran (ondansetron) - for nausea and vomiting  Take any prescribed medications only as directed.  Home care instructions:  Follow any educational materials contained in this packet.  Keep drinking plenty of fluids and use the medicine for nausea as directed.   Drink clear liquids for the next 24 hours and introduce solid foods slowly after 24 hours using the b.r.a.t. diet (Bananas, Rice, Applesauce, Toast, Yogurt).    Follow-up instructions: Please follow-up with your primary care provider in the next 2 days for further evaluation of your symptoms. If you are not feeling better in 48 hours you may have a condition that is more serious and you need re-evaluation.   Return instructions:  SEEK IMMEDIATE MEDICAL ATTENTION IF: If you have pain that does not go away or becomes severe  A temperature above 101F develops  Repeated vomiting occurs (multiple episodes)  If you have pain that becomes localized to portions of the abdomen. The right side could possibly be appendicitis. In an adult, the left lower portion of the abdomen could be colitis or diverticulitis.  Blood is being passed in stools or vomit (bright red or black tarry stools)  You develop chest pain, difficulty breathing, dizziness or fainting, or become confused, poorly responsive, or  inconsolable (young children) If you have any other emergent concerns regarding your health  Additional Information: Abdominal (belly) pain can be caused by many things. Your caregiver performed an examination and possibly ordered blood/urine tests and imaging (CT scan, x-rays, ultrasound). Many cases can be observed and treated at home after initial evaluation in the emergency department. Even though you are being discharged home, abdominal pain can be unpredictable. Therefore, you need a repeated exam if your pain does not resolve, returns, or worsens. Most patients with abdominal pain don't have to be admitted to the hospital or have surgery, but serious problems like appendicitis and gallbladder attacks can start out as nonspecific pain. Many abdominal conditions cannot be diagnosed in one visit, so follow-up evaluations are very important.  Your vital signs today were: BP 138/81   Pulse 95   Temp 98.6 F (37 C)   Resp 18   Ht 6' 2"$  (1.88 m)   Wt 86.2 kg   SpO2 100%   BMI 24.39 kg/m  If your blood pressure (bp) was elevated above 135/85 this visit, please have this repeated by your doctor within one month. --------------

## 2022-04-10 NOTE — ED Triage Notes (Addendum)
Pt BIB EMS for n/v that started about a month ago. Per pt, the n/v happens randomly. Pt endorses alcohol and marijuana use today. Per pt, n/v started today after helping a friend move. Pt denies fevers. Pt also endorses CP.

## 2022-04-10 NOTE — ED Notes (Signed)
Pt laying in bed with eyes closed. Pt able to awake with verbal stimuli. Pt given a ginger ale to drink. Told pt drink it slowly.

## 2022-04-10 NOTE — ED Notes (Signed)
Tolerating POs ok

## 2022-04-10 NOTE — ED Provider Notes (Signed)
[facility] [facility] [place] [facility] Provider Note   CSN: [id number] Arrival date & time: 04/10/22  1501     History  Chief Complaint  Patient presents with   Emesis    Theodore Hendrix is a 32 y.o. male.  Patient with history of polysubstance abuse presents to the emergency department for intermittent episodes of nausea and vomiting.  Last occurred today.  Patient states that the symptoms have been going on for about a month.  No significant abdominal pain, diarrhea.  Patient reports alcohol and marijuana use today.  Fevers.  No treatments prior to arrival.       Home Medications Prior to Admission medications   Medication Sig Start Date End Date Taking? Authorizing Provider  acetaminophen (TYLENOL) 325 MG tablet Take 2 tablets (650 mg total) by mouth every 6 (six) hours as needed for moderate pain. 12/04/21 12/04/22  Georganna Skeans, MD  amoxicillin-clavulanate (AUGMENTIN) 875-125 MG tablet Take 1 tablet by mouth 2 (two) times daily. 06/15/19   Recardo Evangelist, PA-C  meloxicam (MOBIC) 15 MG tablet Take 1 tablet (15 mg total) by mouth daily. 06/15/19   Recardo Evangelist, PA-C  oxyCODONE-acetaminophen (PERCOCET/ROXICET) 5-325 MG tablet Take 1 tablet by mouth every 4 (four) hours as needed for severe pain. 06/15/19   Recardo Evangelist, PA-C      Allergies    Patient has no known allergies.    Review of Systems   Review of Systems  Physical Exam Updated Vital Signs BP 138/81 (BP Location: Left Arm)   Pulse 92   Temp 98.6 F (37 C)   Resp 18   Ht 6' 2"$  (1.88 m)   Wt 86.2 kg   SpO2 97%   BMI 24.39 kg/m   Physical Exam Vitals and nursing note reviewed.  Constitutional:      General: He is not in acute distress.    Appearance: He is well-developed.  HENT:     Head: Normocephalic and atraumatic.     Nose: Nose normal.     Mouth/Throat:     Mouth: Mucous membranes are moist.  Eyes:     General:        Right eye: No discharge.        Left  eye: No discharge.     Conjunctiva/sclera: Conjunctivae normal.  Cardiovascular:     Rate and Rhythm: Normal rate and regular rhythm.     Heart sounds: Normal heart sounds.  Pulmonary:     Effort: Pulmonary effort is normal.     Breath sounds: Normal breath sounds.  Abdominal:     Palpations: Abdomen is soft.     Tenderness: There is no abdominal tenderness. There is no guarding or rebound.     Comments: Abdomen is soft and nontender without rebound or guarding.  He does not react to palpation.  Musculoskeletal:     Cervical back: Normal range of motion and neck supple.  Skin:    General: Skin is warm and dry.  Neurological:     Mental Status: He is alert.  Psychiatric:     Comments: Patient clinically intoxicated     ED Results / Procedures / Treatments   Labs (all labs ordered are listed, but only abnormal results are displayed) Labs Reviewed  CBC WITH DIFFERENTIAL/PLATELET - Abnormal; Notable for the following components:      Result Value   WBC 12.6 (*)    MCV 79.6 (*)    Neutro Abs 8.1 (*)  All other components within normal limits  COMPREHENSIVE METABOLIC PANEL - Abnormal; Notable for the following components:   CO2 21 (*)    Glucose, Bld 101 (*)    Creatinine, Ser 1.25 (*)    Calcium 8.3 (*)    All other components within normal limits  ETHANOL - Abnormal; Notable for the following components:   Alcohol, Ethyl (B) 121 (*)    All other components within normal limits  RESP PANEL BY RT-PCR (RSV, FLU A&B, COVID)  RVPGX2  LIPASE, BLOOD  RAPID URINE DRUG SCREEN, HOSP PERFORMED  TROPONIN I (HIGH SENSITIVITY)    EKG EKG Interpretation  Date/Time:  Tuesday April 10 2022 15:13:28 EST Ventricular Rate:  105 PR Interval:  171 QRS Duration: 82 QT Interval:  332 QTC Calculation: 439 R Axis:   64 Text Interpretation: Sinus tachycardia Probable left atrial enlargement Confirmed by Regan Lemming ([id number]) on 04/10/2022 3:21:24 PM  Radiology DG Chest 2  View  Result Date: 04/10/2022 CLINICAL DATA:  Chest pain.  Emesis. EXAM: CHEST - 2 VIEW COMPARISON:  12/01/2021 FINDINGS: Linear density in the left lower lung is suggestive for atelectasis or mild scarring. Otherwise, the lungs are clear. Heart and mediastinum are within normal limits. Trachea is midline. Negative for a pneumothorax. No pleural effusions. No acute bone abnormality. IMPRESSION: Mild atelectasis or scarring in the left lower lung. Otherwise, no acute cardiopulmonary disease. Electronically Signed   By: Markus Daft M.D.   On: 04/10/2022 15:44    Procedures Procedures    Medications Ordered in ED Medications - No data to display  ED Course/ Medical Decision Making/ A&P    Patient seen and examined. History obtained directly from patient. Work-up including labs, imaging, EKG ordered in triage, if performed, were reviewed.    Labs/EKG: Independently reviewed and interpreted.  This included: CBC with white blood cell count 12.6 otherwise unremarkable; CMP with electrolytes normal, creatinine minimally elevated at 1.25, BUN 15, normal liver function testing; lipase normal; troponin 3; viral panel negative; alcohol increased to 121.  Imaging: Independently visualized and interpreted.  This included: Chest x-ray, agree no acute findings.  Medications/Fluids: None ordered  Most recent vital signs reviewed and are as follows: BP 138/81 (BP Location: Left Arm)   Pulse 92   Temp 98.6 F (37 C)   Resp 18   Ht 6' 2"$  (1.88 m)   Wt 86.2 kg   SpO2 97%   BMI 24.39 kg/m   Initial impression: Intoxication, reported vomiting.  Reassuring exam.    Plan: P.o. challenge.  Reassess.  6:36 PM Reassessment performed. Patient appears stable.  Fluid challenge not yet performed.  Plan: Ambulate patient, fluid challenge.  Home with pepcid, zofran.   Zelaya PA-C aware of patient at shift change.   Will need to ensure patient is safe ambulating and tolerating PO's prior to d/c.                              Medical Decision Making Amount and/or Complexity of Data Reviewed Labs: ordered. Radiology: ordered.   N/V, suspect alcohol use contributing. Abd soft and non-tender.         Final Clinical Impression(s) / ED Diagnoses Final diagnoses:  Alcoholic intoxication without complication (HCC)  Nausea and vomiting, unspecified vomiting type    Rx / DC Orders ED Discharge Orders          Ordered    famotidine (PEPCID) 20 MG tablet  2  times daily        04/10/22 1839    ondansetron (ZOFRAN-ODT) 4 MG disintegrating tablet  Every 8 hours PRN        04/10/22 1839              Carlisle Cater, Hershal Coria 04/10/22 Valerie Roys    Regan Lemming, MD 04/10/22 2320

## 2022-04-10 NOTE — ED Provider Notes (Signed)
 Patient handed off from [place], [place] with plan to observe until more sober, can tolerate PO, and is able to ambulate without assistance. Physical Exam  BP 127/80   Pulse 75   Temp 98.6 F (37 C)   Resp 18   Ht 6\' 2"  (1.88 m)   Wt 86.2 kg   SpO2 100%   BMI 24.39 kg/m   Physical Exam Vitals and nursing note reviewed.  Constitutional:      General: He is not in acute distress.    Appearance: Normal appearance. He is not ill-appearing.  HENT:     Head: Normocephalic and atraumatic.  Cardiovascular:     Rate and Rhythm: Normal rate and regular rhythm.     Pulses: Normal pulses.     Heart sounds: Normal heart sounds.  Skin:    General: Skin is warm and dry.     Capillary Refill: Capillary refill takes less than 2 seconds.  Neurological:     General: No focal deficit present.     Mental Status: He is alert and oriented to person, place, and time. Mental status is at baseline.     Procedures  Procedures  ED Course / MDM    Medical Decision Making Amount and/or Complexity of Data Reviewed Labs: ordered. Radiology: ordered.  Risk Prescription drug management.   Patient was handed off to me by Emmit Alexanders, PA-C. Plan at hand off is to observe for patient to sober given alcohol intoxication. If patient can tolerate PO, ambulate without assistance, safe to discharge home. On first assessment at 7:30PM, patient tolerating PO, but reports dizziness and nausea when sitting up Assessed at 9:30PM and patient reports he feels "drained". Able to tolerate PO and ambulate without support. Reassessed again at 11:30PM and patient is safe for discharge at this time. As he is homeless, he doesn't have a way back to a shelter or to stay with anyone. Informed nurse and secretary and will coordinate to have patient discharged to emergency contact or shelter.       Smitty Knudsen, PA-C 04/11/22 0000    Ernie Avena, MD 04/11/22 [phone]

## 2022-04-10 NOTE — ED Notes (Signed)
Pt is able to be aroused by voice but is resting in bed with eyes closed.

## 2022-04-13 ENCOUNTER — Emergency Department (HOSPITAL_COMMUNITY): Payer: Self-pay

## 2022-04-13 ENCOUNTER — Other Ambulatory Visit: Payer: Self-pay

## 2022-04-13 ENCOUNTER — Encounter (HOSPITAL_COMMUNITY): Payer: Self-pay

## 2022-04-13 ENCOUNTER — Emergency Department (HOSPITAL_COMMUNITY)
Admission: EM | Admit: 2022-04-13 | Discharge: 2022-04-14 | Disposition: A | Discharge status: Home / Self Care | Payer: Self-pay | Attending: Emergency Medicine | Admitting: Emergency Medicine

## 2022-04-13 DIAGNOSIS — T40601A Poisoning by unspecified narcotics, accidental (unintentional), initial encounter: Secondary | ICD-10-CM | POA: Insufficient documentation

## 2022-04-13 DIAGNOSIS — Y907 Blood alcohol level of 200-239 mg/100 ml: Secondary | ICD-10-CM | POA: Insufficient documentation

## 2022-04-13 DIAGNOSIS — F10920 Alcohol use, unspecified with intoxication, uncomplicated: Secondary | ICD-10-CM | POA: Insufficient documentation

## 2022-04-13 LAB — BASIC METABOLIC PANEL
Anion gap: 9 (ref 5–15)
BUN: 15 mg/dL (ref 6–20)
CO2: 23 mmol/L (ref 22–32)
Calcium: 8.4 mg/dL — ABNORMAL LOW (ref 8.9–10.3)
Chloride: 105 mmol/L (ref 98–111)
Creatinine, Ser: 0.92 mg/dL (ref 0.61–1.24)
GFR, Estimated: >60 mL/min (ref >60)
Glucose, Bld: 89 mg/dL (ref 70–99)
Potassium: 3.6 mmol/L (ref 3.5–5.1)
Sodium: 137 mmol/L (ref 135–145)

## 2022-04-13 LAB — CBC WITH DIFFERENTIAL/PLATELET
Abs Immature Granulocytes: 0.02 10*3/uL (ref 0.00–0.07)
Basophils Absolute: 0 10*3/uL (ref 0.0–0.1)
Basophils Relative: 0 %
Eosinophils Absolute: 0 10*3/uL (ref 0.0–0.5)
Eosinophils Relative: 0 %
HCT: 42.7 % (ref 39.0–52.0)
Hemoglobin: 14.3 g/dL (ref 13.0–17.0)
Immature Granulocytes: 0 %
Lymphocytes Relative: 49 %
Lymphs Abs: 5 10*3/uL — ABNORMAL HIGH (ref 0.7–4.0)
MCH: 27.3 pg (ref 26.0–34.0)
MCHC: 33.5 g/dL (ref 30.0–36.0)
MCV: 81.5 fL (ref 80.0–100.0)
Monocytes Absolute: 0.4 10*3/uL (ref 0.1–1.0)
Monocytes Relative: 4 %
Neutro Abs: 4.9 10*3/uL (ref 1.7–7.7)
Neutrophils Relative %: 47 %
Platelets: 234 10*3/uL (ref 150–400)
RBC: 5.24 MIL/uL (ref 4.22–5.81)
RDW: 13.2 % (ref 11.5–15.5)
WBC: 10.4 10*3/uL (ref 4.0–10.5)
nRBC: 0 % (ref 0.0–0.2)

## 2022-04-13 LAB — CBG MONITORING, ED: Glucose-Capillary: 73 mg/dL (ref 70–99)

## 2022-04-13 LAB — ETHANOL: Alcohol, Ethyl (B): 207 mg/dL — ABNORMAL HIGH (ref <10)

## 2022-04-13 MED ORDER — NALOXONE HCL 0.4 MG/ML IJ SOLN
0.4000 mg | Freq: Once | INTRAMUSCULAR | Status: AC
  Administered 2022-04-13: 0.4 mg via INTRAVENOUS
  Filled 2022-04-13: qty 1

## 2022-04-13 MED ORDER — NALOXONE HCL 2 MG/2ML IJ SOSY
1.0000 mg | PREFILLED_SYRINGE | Freq: Once | INTRAMUSCULAR | Status: AC
  Administered 2022-04-13: 1 mg via INTRAVENOUS
  Filled 2022-04-13: qty 2

## 2022-04-13 NOTE — ED Provider Notes (Signed)
[name] [facility] Provider Note   CSN: [id number] Arrival date & time: 04/13/22  2018     History  Chief Complaint  Patient presents with   Drug Overdose    Theodore Hendrix is a 32 y.o. male.  32 yo M with a cc of unintentional opiate overdose.  Patient was found unresponsive.  He was given Narcan with improvement.  Patient is fairly somnolent but he is able to answer some questions.  Level 5 caveat   Drug Overdose       Home Medications Prior to Admission medications   Medication Sig Start Date End Date Taking? Authorizing Provider  famotidine (PEPCID) 20 MG tablet Take 1 tablet (20 mg total) by mouth 2 (two) times daily. 04/10/22   Carlisle Cater, PA-C  ondansetron (ZOFRAN-ODT) 4 MG disintegrating tablet Take 1 tablet (4 mg total) by mouth every 8 (eight) hours as needed for nausea or vomiting. 04/10/22   Carlisle Cater, PA-C      Allergies    Patient has no known allergies.    Review of Systems   Review of Systems  Physical Exam Updated Vital Signs BP 121/88   Pulse 84   Temp (!) 97.5 F (36.4 C) (Oral)   Resp 15   SpO2 97%  Physical Exam Vitals and nursing note reviewed.  Constitutional:      Appearance: He is well-developed.  HENT:     Head: Normocephalic and atraumatic.  Eyes:     Pupils: Pupils are equal, round, and reactive to light.  Neck:     Vascular: No JVD.  Cardiovascular:     Rate and Rhythm: Normal rate and regular rhythm.     Heart sounds: No murmur heard.    No friction rub. No gallop.  Pulmonary:     Effort: No respiratory distress.     Breath sounds: No wheezing.  Abdominal:     General: There is no distension.     Tenderness: There is no abdominal tenderness. There is no guarding or rebound.  Musculoskeletal:        General: Normal range of motion.     Cervical back: Normal range of motion and neck supple.  Skin:    Coloration: Skin is not pale.     Findings: No rash.  Neurological:      Mental Status: He is alert and oriented to person, place, and time.  Psychiatric:        Behavior: Behavior normal.     ED Results / Procedures / Treatments   Labs (all labs ordered are listed, but only abnormal results are displayed) Labs Reviewed  CBC WITH DIFFERENTIAL/PLATELET - Abnormal; Notable for the following components:      Result Value   Lymphs Abs 5.0 (*)    All other components within normal limits  BASIC METABOLIC PANEL - Abnormal; Notable for the following components:   Calcium 8.4 (*)    All other components within normal limits    EKG None  Radiology DG Chest Port 1 View  Result Date: 04/13/2022 CLINICAL DATA:  Drug overdose EXAM: PORTABLE CHEST 1 VIEW COMPARISON:  04/10/2022 FINDINGS: The heart size and mediastinal contours are within normal limits. Both lungs are clear. The visualized skeletal structures are unremarkable. IMPRESSION: No active disease. Electronically Signed   By: Placido Sou M.D.   On: 04/13/2022 20:48    Procedures Procedures    Medications Ordered in ED Medications  naloxone (NARCAN) injection 0.4 mg (0.4 mg  Intravenous Given 04/13/22 2225)  naloxone [facility]) injection 1 mg (1 mg Intravenous Given 04/13/22 2249)    ED Course/ Medical Decision Making/ A&P                             Medical Decision Making Amount and/or Complexity of Data Reviewed Labs: ordered. Radiology: ordered.  Risk Prescription drug management.   32 yo M with a chief complaints of an unintentional opiate overdose.  Was given Narcan by EMS with some improvement.  Will obtain blood work.  Observe in the ED.  Signed out to Dr. Ralene Bathe, please see their note for further details of care in the ED.  The patients results and plan were reviewed and discussed.   Any x-rays performed were independently reviewed by myself.   Differential diagnosis were considered with the presenting HPI.  Medications  naloxone [facility]) injection 0.4 mg (0.4 mg Intravenous  Given 04/13/22 2225)  naloxone [facility]) injection 1 mg (1 mg Intravenous Given 04/13/22 2249)    Vitals:   04/13/22 2030 04/13/22 2130  BP: 125/87 121/88  Pulse: 80 84  Resp: (!) 9 15  Temp: (!) 97.5 F (36.4 C)   TempSrc: Oral   SpO2: 100% 97%    Final diagnoses:  Opiate overdose, accidental or unintentional, initial encounter [name])    Admission/ observation were discussed with the admitting physician, patient and/or family and they are comfortable with the plan.          Final Clinical Impression(s) / ED Diagnoses Final diagnoses:  Opiate overdose, accidental or unintentional, initial encounter [name])    Rx / [place] Orders ED Discharge Orders     None         Deno Etienne, DO 04/13/22 2305

## 2022-04-13 NOTE — Discharge Instructions (Signed)
There is help if you need it.  Please do not use dirty needles, this could cause you a severe infection to your skin, heart or spinal cord.  This could kill you or leave you permanently disabled.  There was a recent study done at [facility] that showed that the risk of death for someone that had unintentionally overdosed on narcotics was as high as 15% in the next year.  This is much higher than most every other medical condition.  [facility] Solution to the Opioid Problem ([facility]STOP) Fixed; mobile; peer-based Peri Maris [phone] [email or web address] Fixed site exchange at [facility], Wednesdays (2-5pm) and Thursdays (4-8pm). [place],  [id number] Call or text to arrange mobile and peer exchange, Mondays (1-4pm) and Fridays (4-7pm). [place] [email or web address]  Suboxone clinic: [facility] [address] [place], [place], [place] [address] [place]

## 2022-04-13 NOTE — ED Triage Notes (Signed)
Pt. BIB [facility] for drug overdose. Pt given '1mg'$  narcan on scene. Given '4mg'$  zofran en route. VSS with EMS.

## 2022-04-14 ENCOUNTER — Other Ambulatory Visit: Payer: Self-pay

## 2022-04-14 ENCOUNTER — Emergency Department (HOSPITAL_COMMUNITY)
Admission: EM | Admit: 2022-04-14 | Discharge: 2022-04-15 | Disposition: A | Discharge status: Home / Self Care | Payer: Self-pay | Attending: Emergency Medicine | Admitting: Emergency Medicine

## 2022-04-14 DIAGNOSIS — R7309 Other abnormal glucose: Secondary | ICD-10-CM | POA: Insufficient documentation

## 2022-04-14 DIAGNOSIS — F10120 Alcohol abuse with intoxication, uncomplicated: Secondary | ICD-10-CM | POA: Insufficient documentation

## 2022-04-14 DIAGNOSIS — R0602 Shortness of breath: Secondary | ICD-10-CM | POA: Insufficient documentation

## 2022-04-14 DIAGNOSIS — F10920 Alcohol use, unspecified with intoxication, uncomplicated: Secondary | ICD-10-CM

## 2022-04-14 LAB — HEPATIC FUNCTION PANEL
ALT: 32 U/L (ref 0–44)
AST: 33 U/L (ref 15–41)
Albumin: 4.3 g/dL (ref 3.5–5.0)
Alkaline Phosphatase: 56 U/L (ref 38–126)
Bilirubin, Direct: 0.2 mg/dL (ref 0.0–0.2)
Indirect Bilirubin: 0.3 mg/dL (ref 0.3–0.9)
Total Bilirubin: 0.5 mg/dL (ref 0.3–1.2)
Total Protein: 7.6 g/dL (ref 6.5–8.1)

## 2022-04-14 NOTE — ED Notes (Signed)
Pt. Given bus pass on discharge

## 2022-04-14 NOTE — ED Triage Notes (Signed)
BIB EMS from [employer] heavily intoxicated.  [employer] called out for welfare check to homeless encampment behind the [employer].  He told them he had shob and some "psychological issues"  Alert and oriented.  Complaints of anxiety to EMS

## 2022-04-15 ENCOUNTER — Emergency Department (HOSPITAL_COMMUNITY): Payer: Self-pay

## 2022-04-15 LAB — CBG MONITORING, ED: Glucose-Capillary: 100 mg/dL — ABNORMAL HIGH (ref 70–99)

## 2022-04-15 MED ORDER — ALUM & MAG HYDROXIDE-SIMETH 200-200-20 MG/5ML PO SUSP
30.0000 mL | Freq: Once | ORAL | Status: AC
  Administered 2022-04-15: 30 mL via ORAL
  Filled 2022-04-15: qty 30

## 2022-04-15 NOTE — ED Notes (Signed)
BGL 100

## 2022-04-15 NOTE — ED Provider Notes (Signed)
[place] EMERGENC[facility] DEPARTMENT AT [facility] Provider Note   CSN: [id number] Arrival date & time: 04/14/22  2123     History  Chief Complaint  Patient presents with   Alcohol Intoxication    Abid Fass is a 32 y.o. male.  The history is provided by the patient and medical records.  Alcohol Intoxication  Saied Boldt is a 32 y.o. male who presents to the Emergency Department complaining of intoxication.  He presents to the emergency department by EMS from behind the [facility] with reports of intoxication.  Patient reports that he does not feel well.  He does report drinking alcohol but no drug use.  Denies any SI, HI.  He did report anxiety and shortness of breath to EMS.  He does not complain of any current shortness of breath.  He has no known medical problems.  He takes no routine medications.     Home Medications Prior to Admission medications   Medication Sig Start Date End Date Taking? Authorizing Provider  famotidine (PEPCID) 20 MG tablet Take 1 tablet (20 mg total) by mouth 2 (two) times daily. 04/10/22   Carlisle Cater, PA-C  ondansetron (ZOFRAN-ODT) 4 MG disintegrating tablet Take 1 tablet (4 mg total) by mouth every 8 (eight) hours as needed for nausea or vomiting. 04/10/22   Carlisle Cater, PA-C      Allergies    Patient has no known allergies.    Review of Systems   Review of Systems  All other systems reviewed and are negative.   Physical Exam Updated Vital Signs BP (!) 149/88   Pulse 89   Temp 97.6 F (36.4 C) (Oral)   Resp 16   Ht '6\' 2"'$  (1.88 m)   Wt 86.2 kg   SpO2 95%   BMI 24.39 kg/m  Physical Exam Vitals and nursing note reviewed.  Constitutional:      Appearance: He is well-developed.  HENT:     Head: Normocephalic and atraumatic.  Cardiovascular:     Rate and Rhythm: Normal rate and regular rhythm.     Heart sounds: No murmur heard. Pulmonary:     Effort: Pulmonary effort is normal. No respiratory distress.     Breath  sounds: Normal breath sounds.  Abdominal:     Palpations: Abdomen is soft.     Tenderness: There is no abdominal tenderness. There is no guarding or rebound.  Musculoskeletal:        General: No tenderness.  Skin:    General: Skin is warm and dry.  Neurological:     Mental Status: He is alert and oriented to person, place, and time.     Comments: Drowsy but awakens to verbal stimuli.  Moves all extremities symmetrically.  Psychiatric:        Behavior: Behavior normal.     ED Results / Procedures / Treatments   Labs (all labs ordered are listed, but only abnormal results are displayed) Labs Reviewed  CBG MONITORING, ED - Abnormal; Notable for the following components:      Result Value   Glucose-Capillary 100 (*)    All other components within normal limits    EKG None  Radiology DG Chest 2 View  Result Date: 04/15/2022 CLINICAL DATA:  Chest pain. EXAM: CHEST - 2 VIEW COMPARISON:  April 13, 2022 FINDINGS: The heart size and mediastinal contours are within normal limits. Both lungs are clear. The visualized skeletal structures are unremarkable. IMPRESSION: No active cardiopulmonary disease. Electronically Signed   By:  Virgina Norfolk M.D.   On: 04/15/2022 04:19   CT Head Wo Contrast  Result Date: 04/14/2022 CLINICAL DATA:  Altered mental status. EXAM: CT HEAD WITHOUT CONTRAST TECHNIQUE: Contiguous axial images were obtained from the base of the skull through the vertex without intravenous contrast. RADIATION DOSE REDUCTION: This exam was performed according to the departmental dose-optimization program which includes automated exposure control, adjustment of the mA and/or kV according to patient size and/or use of iterative reconstruction technique. COMPARISON:  December 01, 2021 FINDINGS: Brain: No evidence of acute infarction, hemorrhage, hydrocephalus or mass lesion/mass effect. A prominent cavum septum pellucidum is seen. A stable 1.9 cm x 2.1 cm arachnoid cyst and/or  prominent CSF space is seen along the posterior fossa, to the left of midline. Vascular: No hyperdense vessel or unexpected calcification. Skull: Normal. Negative for fracture or focal lesion. Sinuses/Orbits: No acute finding. Other: None. IMPRESSION: 1. No acute intracranial abnormality. 2. Stable posterior fossa arachnoid cyst and/or prominent CSF space. Electronically Signed   By: Virgina Norfolk M.D.   On: 04/14/2022 00:11   DG Chest Port 1 View  Result Date: 04/13/2022 CLINICAL DATA:  Drug overdose EXAM: PORTABLE CHEST 1 VIEW COMPARISON:  04/10/2022 FINDINGS: The heart size and mediastinal contours are within normal limits. Both lungs are clear. The visualized skeletal structures are unremarkable. IMPRESSION: No active disease. Electronically Signed   By: Placido Sou M.D.   On: 04/13/2022 20:48    Procedures Procedures    Medications Ordered in ED Medications  alum & mag hydroxide-simeth (MAALOX/MYLANTA) 200-200-20 MG/5ML suspension 30 mL (30 mLs Oral Given 04/15/22 0325)    ED Course/ Medical Decision Making/ A&P                             Medical Decision Making Amount and/or Complexity of Data Reviewed Radiology: ordered.  Risk OTC drugs.   Patient with history of polysubstance abuse, alcohol abuse here for evaluation of alcohol intoxication.  He does report drinking alcohol earlier today, denies any drug use.  He is not actively suicidal, homicidal.  He is able to walk with a steady gait and is interactive and engaging with staff.  He is not clinically intoxicated at this time with no evidence of serious closed head injury.  Given he did complain of shortness of breath earlier a chest x-ray was obtained, which is negative for acute abnormality.  Patient was deemed stable for discharge with outpatient resources and return precautions.        Final Clinical Impression(s) / ED Diagnoses Final diagnoses:  Alcoholic intoxication without complication [facility])    Rx / DC  Orders ED Discharge Orders     None         Quintella Reichert, MD 04/15/22 [phone]

## 2022-04-17 ENCOUNTER — Ambulatory Visit (HOSPITAL_COMMUNITY)
Admission: EM | Admit: 2022-04-17 | Discharge: 2022-04-17 | Disposition: A | Discharge status: Home / Self Care | Payer: No Payment, Other | Attending: Registered Nurse | Admitting: Registered Nurse

## 2022-04-17 ENCOUNTER — Encounter (HOSPITAL_COMMUNITY): Payer: Self-pay | Admitting: Emergency Medicine

## 2022-04-17 ENCOUNTER — Ambulatory Visit (HOSPITAL_COMMUNITY)
Admission: EM | Admit: 2022-04-17 | Discharge: 2022-04-18 | Disposition: A | Discharge status: Other Acute Inpatient Hospital | Payer: No Payment, Other | Source: Home / Self Care

## 2022-04-17 DIAGNOSIS — F32A Depression, unspecified: Secondary | ICD-10-CM | POA: Insufficient documentation

## 2022-04-17 DIAGNOSIS — F1914 Other psychoactive substance abuse with psychoactive substance-induced mood disorder: Secondary | ICD-10-CM | POA: Insufficient documentation

## 2022-04-17 DIAGNOSIS — R45851 Suicidal ideations: Secondary | ICD-10-CM | POA: Insufficient documentation

## 2022-04-17 DIAGNOSIS — Z9151 Personal history of suicidal behavior: Secondary | ICD-10-CM | POA: Insufficient documentation

## 2022-04-17 DIAGNOSIS — F1414 Cocaine abuse with cocaine-induced mood disorder: Secondary | ICD-10-CM | POA: Insufficient documentation

## 2022-04-17 DIAGNOSIS — Z5900 Homelessness unspecified: Secondary | ICD-10-CM | POA: Insufficient documentation

## 2022-04-17 DIAGNOSIS — Z1152 Encounter for screening for COVID-19: Secondary | ICD-10-CM | POA: Insufficient documentation

## 2022-04-17 DIAGNOSIS — F1994 Other psychoactive substance use, unspecified with psychoactive substance-induced mood disorder: Secondary | ICD-10-CM

## 2022-04-17 DIAGNOSIS — Z560 Unemployment, unspecified: Secondary | ICD-10-CM | POA: Insufficient documentation

## 2022-04-17 DIAGNOSIS — F1910 Other psychoactive substance abuse, uncomplicated: Secondary | ICD-10-CM | POA: Diagnosis present

## 2022-04-17 LAB — URINALYSIS, ROUTINE W REFLEX MICROSCOPIC
Bilirubin Urine: NEGATIVE
Glucose, UA: NEGATIVE mg/dL
Hgb urine dipstick: NEGATIVE
Ketones, ur: NEGATIVE mg/dL
Leukocytes,Ua: NEGATIVE
Nitrite: NEGATIVE
Protein, ur: NEGATIVE mg/dL
Specific Gravity, Urine: <1.005 — ABNORMAL LOW (ref 1.005–1.030)
pH: 6 (ref 5.0–8.0)

## 2022-04-17 LAB — COMPREHENSIVE METABOLIC PANEL
ALT: 44 U/L (ref 0–44)
AST: 44 U/L — ABNORMAL HIGH (ref 15–41)
Albumin: 4 g/dL (ref 3.5–5.0)
Alkaline Phosphatase: 59 U/L (ref 38–126)
Anion gap: 9 (ref 5–15)
BUN: 12 mg/dL (ref 6–20)
CO2: 26 mmol/L (ref 22–32)
Calcium: 9 mg/dL (ref 8.9–10.3)
Chloride: 98 mmol/L (ref 98–111)
Creatinine, Ser: 1.03 mg/dL (ref 0.61–1.24)
GFR, Estimated: >60 mL/min (ref >60)
Glucose, Bld: 89 mg/dL (ref 70–99)
Potassium: 4.1 mmol/L (ref 3.5–5.1)
Sodium: 133 mmol/L — ABNORMAL LOW (ref 135–145)
Total Bilirubin: 0.6 mg/dL (ref 0.3–1.2)
Total Protein: 6.9 g/dL (ref 6.5–8.1)

## 2022-04-17 LAB — LIPID PANEL
Cholesterol: 184 mg/dL (ref 0–200)
HDL: 56 mg/dL (ref >40)
LDL Cholesterol: 57 mg/dL (ref 0–99)
Total CHOL/HDL Ratio: 3.3 RATIO
Triglycerides: 353 mg/dL — ABNORMAL HIGH (ref <150)
VLDL: 71 mg/dL — ABNORMAL HIGH (ref 0–40)

## 2022-04-17 LAB — POCT URINE DRUG SCREEN - MANUAL ENTRY (I-SCREEN)
POC Amphetamine UR: NOT DETECTED
POC Amphetamine UR: POSITIVE — AB
POC Buprenorphine (BUP): NOT DETECTED
POC Buprenorphine (BUP): NOT DETECTED
POC Cocaine UR: POSITIVE — AB
POC Cocaine UR: POSITIVE — AB
POC Marijuana UR: POSITIVE — AB
POC Marijuana UR: NOT DETECTED
POC Methadone UR: NOT DETECTED
POC Methadone UR: NOT DETECTED
POC Methamphetamine UR: POSITIVE — AB
POC Methamphetamine UR: POSITIVE — AB
POC Morphine: NOT DETECTED
POC Morphine: NOT DETECTED
POC Oxazepam (BZO): NOT DETECTED
POC Oxazepam (BZO): NOT DETECTED
POC Oxycodone UR: NOT DETECTED
POC Oxycodone UR: NOT DETECTED
POC Secobarbital (BAR): NOT DETECTED
POC Secobarbital (BAR): NOT DETECTED

## 2022-04-17 LAB — RESP PANEL BY RT-PCR (RSV, FLU A&B, COVID)  RVPGX2
Influenza A by PCR: NEGATIVE
Influenza A by PCR: NEGATIVE
Influenza B by PCR: NEGATIVE
Influenza B by PCR: NEGATIVE
Resp Syncytial Virus by PCR: NEGATIVE
Resp Syncytial Virus by PCR: NEGATIVE
SARS Coronavirus 2 by RT PCR: NEGATIVE
SARS Coronavirus 2 by RT PCR: NEGATIVE

## 2022-04-17 LAB — CBC WITH DIFFERENTIAL/PLATELET
Abs Immature Granulocytes: 0.02 10*3/uL (ref 0.00–0.07)
Basophils Absolute: 0 10*3/uL (ref 0.0–0.1)
Basophils Relative: 0 %
Eosinophils Absolute: 0 10*3/uL (ref 0.0–0.5)
Eosinophils Relative: 0 %
HCT: 43.6 % (ref 39.0–52.0)
Hemoglobin: 14.9 g/dL (ref 13.0–17.0)
Immature Granulocytes: 0 %
Lymphocytes Relative: 46 %
Lymphs Abs: 3.4 10*3/uL (ref 0.7–4.0)
MCH: 27.5 pg (ref 26.0–34.0)
MCHC: 34.2 g/dL (ref 30.0–36.0)
MCV: 80.4 fL (ref 80.0–100.0)
Monocytes Absolute: 0.4 10*3/uL (ref 0.1–1.0)
Monocytes Relative: 6 %
Neutro Abs: 3.5 10*3/uL (ref 1.7–7.7)
Neutrophils Relative %: 48 %
Platelets: 221 10*3/uL (ref 150–400)
RBC: 5.42 MIL/uL (ref 4.22–5.81)
RDW: 13.6 % (ref 11.5–15.5)
WBC: 7.5 10*3/uL (ref 4.0–10.5)
nRBC: 0 % (ref 0.0–0.2)

## 2022-04-17 LAB — POC SARS CORONAVIRUS 2 AG: SARSCOV2ONAVIRUS 2 AG: NEGATIVE

## 2022-04-17 LAB — TSH: TSH: 1.288 u[IU]/mL (ref 0.350–4.500)

## 2022-04-17 LAB — ETHANOL
Alcohol, Ethyl (B): 146 mg/dL — ABNORMAL HIGH (ref <10)
Alcohol, Ethyl (B): 13 mg/dL — ABNORMAL HIGH (ref <10)

## 2022-04-17 LAB — MAGNESIUM: Magnesium: 2 mg/dL (ref 1.7–2.4)

## 2022-04-17 MED ORDER — HYDROXYZINE HCL 25 MG PO TABS
25.0000 mg | ORAL_TABLET | Freq: Four times a day (QID) | ORAL | Status: DC | PRN
  Administered 2022-04-17: 25 mg via ORAL

## 2022-04-17 MED ORDER — ACETAMINOPHEN 325 MG PO TABS: 650.0000 mg | ORAL_TABLET | Freq: Four times a day (QID) | ORAL | Status: DC | PRN

## 2022-04-17 MED ORDER — THIAMINE HCL 100 MG/ML IJ SOLN
100.0000 mg | Freq: Once | INTRAMUSCULAR | Status: AC
  Administered 2022-04-17: 100 mg via INTRAMUSCULAR
  Filled 2022-04-17: qty 2

## 2022-04-17 MED ORDER — HYDROXYZINE HCL 25 MG PO TABS
25.0000 mg | ORAL_TABLET | Freq: Three times a day (TID) | ORAL | Status: DC | PRN
  Filled 2022-04-17: qty 1

## 2022-04-17 MED ORDER — HYDROXYZINE HCL 25 MG PO TABS: 25.0000 mg | ORAL_TABLET | Freq: Three times a day (TID) | ORAL | Status: DC | PRN

## 2022-04-17 MED ORDER — TRAZODONE HCL 50 MG PO TABS: 50.0000 mg | ORAL_TABLET | Freq: Every evening | ORAL | Status: DC | PRN

## 2022-04-17 MED ORDER — MAGNESIUM HYDROXIDE 400 MG/5ML PO SUSP: 30.0000 mL | Freq: Every day | ORAL | Status: DC | PRN

## 2022-04-17 MED ORDER — LOPERAMIDE HCL 2 MG PO CAPS: 2.0000 mg | ORAL_CAPSULE | ORAL | Status: DC | PRN

## 2022-04-17 MED ORDER — ADULT MULTIVITAMIN W/MINERALS CH
1.0000 | ORAL_TABLET | Freq: Every day | ORAL | Status: DC
  Administered 2022-04-17 – 2022-04-18 (×2): 1 via ORAL
  Filled 2022-04-17 (×2): qty 1

## 2022-04-17 MED ORDER — ALUM & MAG HYDROXIDE-SIMETH 200-200-20 MG/5ML PO SUSP: 30.0000 mL | ORAL | Status: DC | PRN

## 2022-04-17 MED ORDER — TRAZODONE HCL 50 MG PO TABS
50.0000 mg | ORAL_TABLET | Freq: Every evening | ORAL | Status: DC | PRN
  Administered 2022-04-17: 50 mg via ORAL
  Filled 2022-04-17: qty 1

## 2022-04-17 MED ORDER — THIAMINE MONONITRATE 100 MG PO TABS
100.0000 mg | ORAL_TABLET | Freq: Every day | ORAL | Status: DC
  Administered 2022-04-18: 100 mg via ORAL
  Filled 2022-04-17: qty 1

## 2022-04-17 MED ORDER — ONDANSETRON 4 MG PO TBDP: 4.0000 mg | ORAL_TABLET | Freq: Four times a day (QID) | ORAL | Status: DC | PRN

## 2022-04-17 MED ORDER — LORAZEPAM 1 MG PO TABS: 1.0000 mg | ORAL_TABLET | Freq: Four times a day (QID) | ORAL | Status: DC | PRN
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
## 2022-04-17 NOTE — Care Management (Signed)
OBS Care Management   Patient was seen earlier today and returned seeking services.  Writer contacted [facility] and they reported that they do have a bed for the patient to come to tomorrow 04-18-2022 at [place] spoke to [place] at [place] [phone]) and faxed the patient intake paperwork to [phone]).   Writer informed the NP Thomes Lolling at [facility].
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
## 2022-04-17 NOTE — ED Provider Notes (Cosign Needed Addendum)
Behavioral Health Urgent Care Medical Screening Exam  Patient Name: Theodore Hendrix MRN: [id number] Date of Evaluation: 04/17/22 Chief Complaint:   Diagnosis:  Final diagnoses:  Substance induced mood disorder ([place])  Polysubstance abuse ([place])    History of Present illness: Theodore Hendrix 32 y.o., male patient presented to [facility] via law enforcement after calling stating that he needs help with drug use.    Patient seen face to face by this provider, consulted with Dr. Hampton Abbot; and chart reviewed on 04/17/22.  On evaluation Theodore Hendrix reports he has a chronic history of polysubstance use (cocaine and alcohol). Patient reports that he is here today seeking treatment for such issues. Patient reports that he drinks beer daily about one to two cases a day. States he last drank this morning 42 oz beer. Reports he also uses cocaine, but it has been two to three days since last cocaine use. Patient reporting that on February 23rd after using some cocaine laced with fentanyl ended up in the accidental overdose. Patient reports that he is currently unemployed and homeless. Reports he lost his job six months ago from [employer] and gamble after I followed with his family and no longer having transportation to get to work period patient reporting he has been homeless for six months off of. Patient denies any other prior psychiatric history other than substance but states that he has had three prior suicide attempts. During evaluation Theodore Hendrix is sitting in chair with shades on.  There is no noted distress.  He is alert/oriented x 4, calm, cooperative, and attentive.  His responses were appropriated to assessment questions.  His mood is dysphoric with congruent affect.  He spoke in a clear tone at moderate volume, and normal pace, with good eye contact.   He denies active suicidal/self-harm/homicidal ideation, psychosis, and paranoia.  Objectively:  there is no evidence of psychosis/mania or delusional  thinking.  He conversed coherently, with goal directed thoughts, and no distractibility, or pre-occupation.    [place] ED from 04/17/2022 in [facility] ED from 04/14/2022 in [facility] Emergency Department at [facility] ED from 04/13/2022 in [facility] Emergency Department at [place] High Risk No Risk No Risk       Psychiatric Specialty Exam  Presentation  General Appearance:Appropriate for Environment; Disheveled  Eye Contact:Good  Speech:Clear and Coherent; Normal Rate  Speech Volume:Normal  Handedness:Right   Mood and Affect  Mood: Dysphoric  Affect: Congruent   Thought Process  Thought Processes: Coherent; Goal Directed  Descriptions of Associations:Intact  Orientation:Full (Time, Place and Person)  Thought Content:Logical  Diagnosis of Schizophrenia or Schizoaffective disorder in past: No   Hallucinations:None  Ideas of Reference:None  Suicidal Thoughts:Yes, Passive Without Intent; Without Plan  Homicidal Thoughts:No   Sensorium  Memory: Immediate Good; Recent Good; Remote Good  Judgment: Intact  Insight: Fair; Present   Executive Functions  Concentration: Good  Attention Span: Good  Recall: Good  Fund of Knowledge: Good  Language: Good   Psychomotor Activity  Psychomotor Activity: Normal   Assets  Assets: Communication Skills; Desire for Improvement; Physical Health   Sleep  Sleep: Fair  Number of hours: No data recorded  Physical Exam: Physical Exam Vitals and nursing note reviewed. Exam conducted with a chaperone present.  Constitutional:      General: He is not in acute distress.    Appearance: Normal appearance. He is not ill-appearing.  HENT:     Head:  Normocephalic.  Eyes:     Conjunctiva/sclera: Conjunctivae normal.  Cardiovascular:     Rate and Rhythm: Normal rate.  Pulmonary:     Effort: Pulmonary effort is normal.   Musculoskeletal:        General: Normal range of motion.     Cervical back: Normal range of motion.  Skin:    General: Skin is warm and dry.  Neurological:     Mental Status: He is alert and oriented to person, place, and time.  Psychiatric:        Attention and Perception: Attention and perception normal. He does not perceive auditory or visual hallucinations.        Mood and Affect: Affect normal. Mood is anxious.        Speech: Speech normal.        Behavior: Behavior normal. Behavior is cooperative.        Thought Content: Thought content is not paranoid or delusional. Thought content does not include homicidal ideation. Suicidal: Passive thoughts with no intent or plan.       Cognition and Memory: Cognition normal.        Judgment: Judgment normal.    Review of Systems  Psychiatric/Behavioral:  Depression: Stable. Hallucinations: Denies at this time. Substance abuse: Alcohol and cocaine. Suicidal ideas: State has thoughts that no one would miss him if he was gone. Nervous/anxious: Stable.        Reports no psych history other than substance use and this is the first time that he is reaching out for help "I want to stop doing things that are hurting me."  All other systems reviewed and are negative.  Blood pressure (!) 153/102, pulse 98, temperature 98 F (36.7 C), temperature source Oral, resp. rate 18, SpO2 98 %. There is no height or weight on file to calculate BMI.  Musculoskeletal: Strength & Muscle Tone: within normal limits Gait & Station: normal Patient leans: N/A   [place] MSE Discharge Disposition for Follow up and Recommendations: Based on my evaluation the patient does not appear to have an emergency medical condition and can be discharged with resources and follow up care in outpatient services for Medication Management and Rehab services   [facility] available at [facility] at 5 PM today Patient would rather go to [facility] than admission to continuous assessment  [place],  NP 04/17/2022, 11:34 AM

## 2022-04-17 NOTE — ED Provider Notes (Signed)
[facility] Continuous Assessment Admission H&P  Date: 04/17/22 Patient Name: Larrion Ringger MRN: [id number] Chief Complaint: "I came back for treatment".  Diagnoses:  Final diagnoses:  Substance induced mood disorder ([place])  Polysubstance abuse (H[facility])   HPI: History of Present illness: [name] 32 y.o., male patient presented to [facility] earlier in the day via law enforcement after calling stating that he needs help with drug use.  He was evaluated and was offered admission to the observation unit while awaiting a bed at [facility].  He declined at that time.  He presents at this time willing to accept bed at [facility].  He does not feel that he can keep himself away from alcohol or cocaine if he were to be discharged.  He will be admitted to the continuous assessment unit and will be discharged to [facility] tomorrow with a bed that will be ready for him at 5 PM.   Patient seen face to face by this provider chart reviewed on 04/17/22.  Per chart review patient has a past psychiatric history of polysubstance abuse (cocaine and alcohol).  Per evaluation by Earleen Newport, NP, "Patient reports that he is here today seeking treatment for such issues. Patient reports that he drinks beer daily about one to two cases a day. States he last drank this morning 42 oz beer. Reports he also uses cocaine, but it has been two to three days since last cocaine use. Patient reporting that on February 23rd after using some cocaine laced with fentanyl ended up in the accidental overdose. Patient reports that he is currently unemployed and homeless. Reports he lost his job six months ago from [employer] and gam[employer] after I followed with his family and no longer having transportation to get to work period patient reporting he has been homeless for six months off of. Patient denies any other prior psychiatric history other than substance but states that he has had three prior suicide attempts."  On evaluation patient is observed sitting in  the assessment room in no acute distress.  He is alert/oriented x 4, calm, cooperative, and attentive.  He has normal speech and behavior.  He endorses some depression related to his circumstances.  He has a dysphoric affect.  He denies any active suicidal ideations.  He endorses passive suicidal ideations with no intent, plan, or access to means.  He verbally contracts for safety.  He denies HI/AVH. Objectively:  there is no evidence of psychosis/mania or delusional thinking.  He conversed coherently, with goal directed thoughts, and no distractibility, or pre-occupation.    Per A[facility] Johnnye [facility] care management, " Writer contacted [facility] and they reported that they do have a bed for the patient to come to tomorrow 04-18-2022 at [place] spoke to [place] [facility] [phone]) and faxed the patient intake paperwork to [phone]).".   Total Time spent with patient: 30 minutes  Musculoskeletal  Strength & Muscle Tone: within normal limits Gait & Station: normal Patient leans: N/A  Psychiatric Specialty Exam  Presentation General Appearance:  Appropriate for Environment; Disheveled  Eye Contact: Good  Speech: Clear and Coherent; Normal Rate  Speech Volume: Normal  Handedness: Right   Mood and Affect  Mood: Dysphoric  Affect: Congruent   Thought Process  Thought Processes: Coherent; Goal Directed  Descriptions of Associations:Intact  Orientation:Full (Time, Place and Person)  Thought Content:Logical  Diagnosis of Schizophrenia or Schizoaffective disorder in past: No  Duration of Psychotic Symptoms: Greater than six months  Hallucinations:Hallucinations: None  Ideas of Reference:None  Suicidal Thoughts:Suicidal Thoughts: Yes, Passive SI Passive Intent and/or Plan: Without Intent; Without Plan; Without Access to Means  Homicidal Thoughts:Homicidal Thoughts: No   Sensorium  Memory: Immediate Good; Recent Good; Remote  Good  Judgment: Intact  Insight: Fair   Community education officer  Concentration: Good  Attention Span: Good  Recall: Good  Fund of Knowledge: Good  Language: Good   Psychomotor Activity  Psychomotor Activity: Psychomotor Activity: Normal   Assets  Assets: Communication Skills; Desire for Improvement; Resilience; Social Support; Physical Health   Sleep  Sleep: Sleep: Fair   Nutritional Assessment (For OBS and FBC admissions only) Has the patient had a weight loss or gain of 10 pounds or more in the last 3 months?: No Has the patient had a decrease in food intake/or appetite?: No Does the patient have dental problems?: No Does the patient have eating habits or behaviors that may be indicators of an eating disorder including binging or inducing vomiting?: No Has the patient recently lost weight without trying?: 0 Has the patient been eating poorly because of a decreased appetite?: 0 Malnutrition Screening Tool Score: 0    Physical Exam Vitals and nursing note reviewed.  Constitutional:      General: He is not in acute distress.    Appearance: He is well-developed.  Eyes:     General:        Right eye: No discharge.        Left eye: No discharge.     Conjunctiva/sclera: Conjunctivae normal.  Cardiovascular:     Rate and Rhythm: Normal rate.  Pulmonary:     Effort: Pulmonary effort is normal. No respiratory distress.  Musculoskeletal:        General: Normal range of motion.     Cervical back: Normal range of motion.  Skin:    Coloration: Skin is not jaundiced or pale.  Neurological:     Mental Status: He is alert and oriented to person, place, and time.  Psychiatric:        Attention and Perception: Attention and perception normal.        Mood and Affect: Mood normal.        Speech: Speech normal.        Behavior: Behavior is cooperative.        Thought Content: Thought content includes suicidal ideation. Thought content does not include suicidal  plan.        Cognition and Memory: Cognition normal.        Judgment: Judgment normal.    Review of Systems  Constitutional: Negative.   HENT: Negative.    Eyes: Negative.   Respiratory: Negative.    Cardiovascular: Negative.   Musculoskeletal: Negative.   Skin: Negative.   Neurological: Negative.   Psychiatric/Behavioral:  Positive for substance abuse.     Blood pressure (!) 167/101, pulse 88, temperature 98.4 F (36.9 C), temperature source Oral, resp. rate 18, SpO2 100 %. There is no height or weight on file to calculate BMI.  Past Psychiatric History: poly substance abuse and anxiety   Is the patient at risk to self? No  Has the patient been a risk to self in the past 6 months? No .    Has the patient been a risk to self within the distant past? No   Is the patient a risk to others? No   Has the patient been a risk to others in the past 6 months? No   Has the patient been a  risk to others within the distant past? No   Past Medical History:  Past Medical History:  Diagnosis Date   ADHD (attention deficit hyperactivity disorder)    Anxiety      Family History: no pertinent   Social History:   Religion: Religion/Spirituality Are You A Religious Person?: Yes What is Your Religious Affiliation?:  Darrick Meigs) How Might This Affect Treatment?: Patient states, "I don't know".   Leisure/Recreation: Leisure / Recreation Do You Have Hobbies?: No   Exercise/Diet: Exercise/Diet Do You Exercise?: No Have You Gained or Lost A Significant Amount of Weight in the Past Six Months?: No Do You Follow a Special Diet?: No Do You Have Any Trouble Sleeping?: Yes Explanation of Sleeping Difficulties: Patient states that he doesn't sleep and doesn't know the last time he has been to sleep.     CCA Employment/Education Employment/Work Situation: Employment / Work Situation Employment Situation: Unemployed Patient's Job has Been Impacted by Current Illness: No Has Patient ever  Been in Passenger transport manager?: No   Education: Education Is Patient Currently Attending School?: No Last Grade Completed:  (12th grade) Did You Attend College?: No Did You Have An Individualized Education Program (IIEP): No Did You Have Any Difficulty At School?: No Patient's Education Has Been Impacted by Current Illness: No     CCA Family/Childhood History Family and Relationship History: Family history Marital status: Single Does patient have children?: Yes How many children?: 1 How is patient's relationship with their children?: 54 y/o daugter   Childhood History:  Childhood History By whom was/is the patient raised?: Other (Comment) Did patient suffer any verbal/emotional/physical/sexual abuse as a child?: No Did patient suffer from severe childhood neglect?: No Has patient ever been sexually abused/assaulted/raped as an adolescent or adult?: No Was the patient ever a victim of a crime or a disaster?: No Witnessed domestic violence?: No Has patient been affected by domestic violence as an adult?: No         CCA Substance Use Alcohol/Drug Use: Alcohol / Drug Use Pain Medications: SEE MAR Prescriptions: SEE MAR Over the Counter: SEE MAR History of alcohol / drug use?: Yes Longest period of sobriety (when/how long): No periods of sobriety. Negative Consequences of Use: Work / Youth worker, Charity fundraiser relationships, Museum/gallery curator Withdrawal Symptoms: Seizures, Irritability, Nausea / Vomiting, Weakness, Sweats Onset of Seizures: 4-5 days ago Date of most recent seizure: 4-5 days ago Substance #1 Name of Substance 1: Alchol 1 - Age of First Use: 16 1 - Amount (size/oz): 1-2 cases of beer 1 - Frequency: daily 1 - Duration: on-going 1 - Last Use / Amount: 04/17/2022 1 - Method of Aquiring: varies 1- Route of Use: oral Substance #2 Name of Substance 2: Cocaine 2 - Age of First Use: 32 years old 2 - Amount (size/oz): .5 mg's per use 2 - Frequency: 2-3 times per use 2 - Duration:  on-going 2 - Last Use / Amount: 2-3 months ago 2 - Method of Aquiring: varies 2 - Route of Substance Use: snort  Last Labs:  Admission on 04/17/2022, Discharged on 04/17/2022  Component Date Value Ref Range Status   SARS Coronavirus 2 by RT PCR 04/17/2022 NEGATIVE  NEGATIVE Final   Influenza A by PCR 04/17/2022 NEGATIVE  NEGATIVE Final   Influenza B by PCR 04/17/2022 NEGATIVE  NEGATIVE Final   Comment: (NOTE) The Xpert Xpress SARS-CoV-2/FLU/RSV plus assay is intended as an aid in the diagnosis of influenza from Nasopharyngeal swab specimens and should not be used as a sole basis for  treatment. Nasal washings and aspirates are unacceptable for Xpert Xpress SARS-CoV-2/FLU/RSV testing.  Fact Sheet for Patients: EntrepreneurPulse.com.au  Fact Sheet for Healthcare Providers: IncredibleEmployment.be  This test is not yet approved or cleared by the [place] FDA and has been authorized for detection and/or diagnosis of SARS-CoV-2 by FDA under an Emergency Use Authorization (EUA). This EUA will remain in effect (meaning this test can be used) for the duration of the COVID-19 declaration under Section 564(b)(1) of the Act, 21 U.S.C. section 360bbb-3(b)(1), unless the authorization is terminated or revoked.     Resp Syncytial Virus by PCR 04/17/2022 NEGATIVE  NEGATIVE Final   Comment: (NOTE) Fact Sheet for Patients: EntrepreneurPulse.com.au  Fact Sheet for Healthcare Providers: IncredibleEmployment.be  This test is not yet approved or cleared by the [place] FDA and has been authorized for detection and/or diagnosis of SARS-CoV-2 by FDA under an Emergency Use Authorization (EUA). This EUA will remain in effect (meaning this test can be used) for the duration of the COVID-19 declaration under Section 564(b)(1) of the Act, 21 U.S.C. section 360bbb-3(b)(1), unless the authorization is terminated  or revoked.  Performed at [facility], [place] [address] [place], [place] [address]    WBC 04/17/2022 7.5  4.0 - 10.5 K/uL Final   RBC 04/17/2022 5.42  4.22 - 5.81 MIL/uL Final   Hemoglobin 04/17/2022 14.9  13.0 - 17.0 g/dL Final   HCT 04/17/2022 43.6  39.0 - 52.0 % Final   MCV 04/17/2022 80.4  80.0 - 100.0 fL Final   MCH 04/17/2022 27.5  26.0 - 34.0 pg Final   MCHC 04/17/2022 34.2  30.0 - 36.0 g/dL Final   RDW 04/17/2022 13.6  11.5 - 15.5 % Final   Platelets 04/17/2022 221  150 - 400 K/uL Final   nRBC 04/17/2022 0.0  0.0 - 0.2 % Final   Neutrophils Relative % 04/17/2022 48  % Final   Neutro Abs 04/17/2022 3.5  1.7 - 7.7 K/uL Final   Lymphocytes Relative 04/17/2022 46  % Final   Lymphs Abs 04/17/2022 3.4  0.7 - 4.0 K/uL Final   Monocytes Relative 04/17/2022 6  % Final   Monocytes Absolute 04/17/2022 0.4  0.1 - 1.0 K/uL Final   Eosinophils Relative 04/17/2022 0  % Final   Eosinophils Absolute 04/17/2022 0.0  0.0 - 0.5 K/uL Final   Basophils Relative 04/17/2022 0  % Final   Basophils Absolute 04/17/2022 0.0  0.0 - 0.1 K/uL Final   Immature Granulocytes 04/17/2022 0  % Final   Abs Immature Granulocytes 04/17/2022 0.02  0.00 - 0.07 K/uL Final   Performed at [facility], [place] [address], [place], [place] [address]   Sodium 04/17/2022 133 (L)  135 - 145 mmol/L Final   Potassium 04/17/2022 4.1  3.5 - 5.1 mmol/L Final   Chloride 04/17/2022 98  98 - 111 mmol/L Final   CO2 04/17/2022 26  22 - 32 mmol/L Final   Glucose, Bld 04/17/2022 89  70 - 99 mg/dL Final   Glucose reference range applies only to samples taken after fasting for at least 8 hours.   BUN 04/17/2022 12  6 - 20 mg/dL Final   Creatinine, Ser 04/17/2022 1.03  0.61 - 1.24 mg/dL Final   Calcium 04/17/2022 9.0  8.9 - 10.3 mg/dL Final   Total Protein 04/17/2022 6.9  6.5 - 8.1 g/dL Final   Albumin 04/17/2022 4.0  3.5 - 5.0 g/dL Final   AST 04/17/2022 44 (H)  15 -  41 U/L Final   ALT 04/17/2022 44  0 - 44 U/L Final    Alkaline Phosphatase 04/17/2022 59  38 - 126 U/L Final   Total Bilirubin 04/17/2022 0.6  0.3 - 1.2 mg/dL Final   GFR, Estimated 04/17/2022 >60  >60 mL/min Final   Comment: (NOTE) Calculated using the CKD-EPI Creatinine Equation (2021)    Anion gap 04/17/2022 9  5 - 15 Final   Performed at [facility], [place] [address], [place], [place] [address]   Magnesium 04/17/2022 2.0  1.7 - 2.4 mg/dL Final   Performed at [place] [address], [place], [place] [address]   Alcohol, Ethyl (B) 04/17/2022 146 (H)  <10 mg/dL Final   Comment: (NOTE) Lowest detectable limit for serum alcohol is 10 mg/dL.  For medical purposes only. Performed at [facility], [place] [address], [place], [place] [address]    Color, Urine 04/17/2022 YELLOW  YELLOW Final   APPearance 04/17/2022 CLEAR  CLEAR Final   Specific Gravity, Urine 04/17/2022 <1.005 (L)  1.005 - 1.030 Final   pH 04/17/2022 6.0  5.0 - 8.0 Final   Glucose, UA 04/17/2022 NEGATIVE  NEGATIVE mg/dL Final   Hgb urine dipstick 04/17/2022 NEGATIVE  NEGATIVE Final   Bilirubin Urine 04/17/2022 NEGATIVE  NEGATIVE Final   Ketones, ur 04/17/2022 NEGATIVE  NEGATIVE mg/dL Final   Protein, ur 04/17/2022 NEGATIVE  NEGATIVE mg/dL Final   Nitrite 04/17/2022 NEGATIVE  NEGATIVE Final   Leukocytes,Ua 04/17/2022 NEGATIVE  NEGATIVE Final   Comment: Microscopic not done on urines with negative protein, blood, leukocytes, nitrite, or glucose < 500 mg/dL. Performed at [facility], [place] [address] [place], [place] [address]    POC Amphetamine UR 04/17/2022 None Detected  NONE DETECTED (Cut Off Level 1000 ng/mL) Final   POC Secobarbital (BAR) 04/17/2022 None Detected  NONE DETECTED (Cut Off Level 300 ng/mL) Final   POC Buprenorphine (BUP) 04/17/2022 None Detected  NONE DETECTED (Cut Off Level 10 ng/mL) Final   POC Oxazepam (BZO) 04/17/2022 None Detected  NONE DETECTED (Cut Off Level 300 ng/mL) Final   POC Cocaine UR 04/17/2022 Positive (A)  NONE  DETECTED (Cut Off Level 300 ng/mL) Final   POC Methamphetamine UR 04/17/2022 Positive (A)  NONE DETECTED (Cut Off Level 1000 ng/mL) Final   POC Morphine 04/17/2022 None Detected  NONE DETECTED (Cut Off Level 300 ng/mL) Final   POC Methadone UR 04/17/2022 None Detected  NONE DETECTED (Cut Off Level 300 ng/mL) Final   POC Oxycodone UR 04/17/2022 None Detected  NONE DETECTED (Cut Off Level 100 ng/mL) Final   POC Marijuana UR 04/17/2022 Positive (A)  NONE DETECTED (Cut Off Level 50 ng/mL) Final   SARSCOV2ONAVIRUS 2 AG 04/17/2022 NEGATIVE  NEGATIVE Final   Comment: (NOTE) SARS-CoV-2 antigen NOT DETECTED.   Negative results are presumptive.  Negative results do not preclude SARS-CoV-2 infection and should not be used as the sole basis for treatment or other patient management decisions, including infection  control decisions, particularly in the presence of clinical signs and  symptoms consistent with COVID-19, or in those who have been in contact with the virus.  Negative results must be combined with clinical observations, patient history, and epidemiological information. The expected result is Negative.  Fact Sheet for Patients: HandmadeRecipes.com.cy  Fact Sheet for Healthcare Providers: FuneralLife.at  This test is not yet approved or cleared by the [place] FDA and  has been authorized for detection and/or diagnosis of SARS-CoV-2 by  FDA under an Emergency Use Authorization (EUA).  This EUA will remain in effect (meaning this test can be used) for the duration of  the COV                          [place]-19 declaration under Section 564(b)(1) of the Act, 21 U.S.C. section 360bbb-3(b)(1), unless the authorization is terminated or revoked sooner.     Cholesterol 04/17/2022 184  0 - 200 mg/dL Final   Triglycerides 04/17/2022 353 (H)  <150 mg/dL Final   HDL 04/17/2022 56  >40 mg/dL Final   Total CHOL/HDL Ratio 04/17/2022 3.3  RATIO Final    VLDL 04/17/2022 71 (H)  0 - 40 mg/dL Final   LDL Cholesterol 04/17/2022 57  0 - 99 mg/dL Final   Comment:        Total Cholesterol/HDL:CHD Risk Coronary Heart Disease Risk Table                     Men   Women  1/2 Average Risk   3.4   3.3  Average Risk       5.0   4.4  2 X Average Risk   9.6   7.1  3 X Average Risk  23.4   11.0        Use the calculated Patient Ratio above and the CHD Risk Table to determine the patient's CHD Risk.        ATP III CLASSIFICATION (LDL):  <100     mg/dL   Optimal  100-129  mg/dL   Near or Above                    Optimal  130-159  mg/dL   Borderline  160-189  mg/dL   High  >190     mg/dL   Very High Performed at [facility] [address], [place], [place] [address]    TSH 04/17/2022 1.288  0.350 - 4.500 uIU/mL Final   Comment: Performed by a 3rd Generation assay with a functional sensitivity of <=0.01 uIU/mL. Performed at [facility], [place] [address], [place],  [address]   Admission on 04/14/2022, Discharged on 04/15/2022  Component Date Value Ref Range Status   Glucose-Capillary 04/15/2022 100 (H)  70 - 99 mg/dL Final   Glucose reference range applies only to samples taken after fasting for at least 8 hours.  Admission on 04/13/2022, Discharged on 04/14/2022  Component Date Value Ref Range Status   WBC 04/13/2022 10.4  4.0 - 10.5 K/uL Final   RBC 04/13/2022 5.24  4.22 - 5.81 MIL/uL Final   Hemoglobin 04/13/2022 14.3  13.0 - 17.0 g/dL Final   HCT 04/13/2022 42.7  39.0 - 52.0 % Final   MCV 04/13/2022 81.5  80.0 - 100.0 fL Final   MCH 04/13/2022 27.3  26.0 - 34.0 pg Final   MCHC 04/13/2022 33.5  30.0 - 36.0 g/dL Final   RDW 04/13/2022 13.2  11.5 - 15.5 % Final   Platelets 04/13/2022 234  150 - 400 K/uL Final   nRBC 04/13/2022 0.0  0.0 - 0.2 % Final   Neutrophils Relative % 04/13/2022 47  % Final   Neutro Abs 04/13/2022 4.9  1.7 - 7.7 K/uL Final   Lymphocytes Relative 04/13/2022 49  % Final   Lymphs Abs 04/13/2022  5.0 (H)  0.7 - 4.0 K/uL Final   Monocytes Relative 04/13/2022 4  % Final  Monocytes Absolute 04/13/2022 0.4  0.1 - 1.0 K/uL Final   Eosinophils Relative 04/13/2022 0  % Final   Eosinophils Absolute 04/13/2022 0.0  0.0 - 0.5 K/uL Final   Basophils Relative 04/13/2022 0  % Final   Basophils Absolute 04/13/2022 0.0  0.0 - 0.1 K/uL Final   Immature Granulocytes 04/13/2022 0  % Final   Abs Immature Granulocytes 04/13/2022 0.02  0.00 - 0.07 K/uL Final   Performed at [facility], [place] [address] [place], [place] [address]   Sodium 04/13/2022 137  135 - 145 mmol/L Final   Potassium 04/13/2022 3.6  3.5 - 5.1 mmol/L Final   Chloride 04/13/2022 105  98 - 111 mmol/L Final   CO2 04/13/2022 23  22 - 32 mmol/L Final   Glucose, Bld 04/13/2022 89  70 - 99 mg/dL Final   Glucose reference range applies only to samples taken after fasting for at least 8 hours.   BUN 04/13/2022 15  6 - 20 mg/dL Final   Creatinine, Ser 04/13/2022 0.92  0.61 - 1.24 mg/dL Final   Calcium 04/13/2022 8.4 (L)  8.9 - 10.3 mg/dL Final   GFR, Estimated 04/13/2022 >60  >60 mL/min Final   Comment: (NOTE) Calculated using the CKD-EPI Creatinine Equation (2021)    Anion gap 04/13/2022 9  5 - 15 Final   Performed at [facility], [place] [address], [place], [place] [address]   Total Protein 04/13/2022 7.6  6.5 - 8.1 g/dL Final   Albumin 04/13/2022 4.3  3.5 - 5.0 g/dL Final   AST 04/13/2022 33  15 - 41 U/L Final   ALT 04/13/2022 32  0 - 44 U/L Final   Alkaline Phosphatase 04/13/2022 56  38 - 126 U/L Final   Total Bilirubin 04/13/2022 0.5  0.3 - 1.2 mg/dL Final   Bilirubin, Direct 04/13/2022 0.2  0.0 - 0.2 mg/dL Final   Indirect Bilirubin 04/13/2022 0.3  0.3 - 0.9 mg/dL Final   Performed at [facility], [place] [address], [place], [place] [address]   Alcohol, Ethyl (B) 04/13/2022 207 (H)  <10 mg/dL Final   Comment: (NOTE) Lowest detectable limit for serum alcohol is 10  mg/dL.  For medical purposes only. Performed at [facility], [place] [address] [place], [place] [address]    Glucose-Capillary 04/13/2022 73  70 - 99 mg/dL Final   Glucose reference range applies only to samples taken after fasting for at least 8 hours.  Admission on 04/10/2022, Discharged on 04/11/2022  Component Date Value Ref Range Status   WBC 04/10/2022 12.6 (H)  4.0 - 10.5 K/uL Final   RBC 04/10/2022 5.55  4.22 - 5.81 MIL/uL Final   Hemoglobin 04/10/2022 15.0  13.0 - 17.0 g/dL Final   HCT 04/10/2022 44.2  39.0 - 52.0 % Final   MCV 04/10/2022 79.6 (L)  80.0 - 100.0 fL Final   MCH 04/10/2022 27.0  26.0 - 34.0 pg Final   MCHC 04/10/2022 33.9  30.0 - 36.0 g/dL Final   RDW 04/10/2022 13.2  11.5 - 15.5 % Final   Platelets 04/10/2022 268  150 - 400 K/uL Final   nRBC 04/10/2022 0.0  0.0 - 0.2 % Final   Neutrophils Relative % 04/10/2022 65  % Final   Neutro Abs 04/10/2022 8.1 (H)  1.7 - 7.7 K/uL Final   Lymphocytes Relative 04/10/2022 30  % Final   Lymphs Abs 04/10/2022 3.8  0.7 - 4.0 K/uL Final   Monocytes Relative  04/10/2022 5  % Final   Monocytes Absolute 04/10/2022 0.6  0.1 - 1.0 K/uL Final   Eosinophils Relative 04/10/2022 0  % Final   Eosinophils Absolute 04/10/2022 0.0  0.0 - 0.5 K/uL Final   Basophils Relative 04/10/2022 0  % Final   Basophils Absolute 04/10/2022 0.0  0.0 - 0.1 K/uL Final   Immature Granulocytes 04/10/2022 0  % Final   Abs Immature Granulocytes 04/10/2022 0.04  0.00 - 0.07 K/uL Final   Performed at [facility], [place] [place], [place] [address]   Sodium 04/10/2022 136  135 - 145 mmol/L Final   Potassium 04/10/2022 3.7  3.5 - 5.1 mmol/L Final   Chloride 04/10/2022 105  98 - 111 mmol/L Final   CO2 04/10/2022 21 (L)  22 - 32 mmol/L Final   Glucose, Bld 04/10/2022 101 (H)  70 - 99 mg/dL Final   Glucose reference range applies only to samples taken after fasting for at least 8 hours.   BUN 04/10/2022 15  6 - 20 mg/dL Final    Creatinine, Ser 04/10/2022 1.25 (H)  0.61 - 1.24 mg/dL Final   Calcium 04/10/2022 8.3 (L)  8.9 - 10.3 mg/dL Final   Total Protein 04/10/2022 7.2  6.5 - 8.1 g/dL Final   Albumin 04/10/2022 4.0  3.5 - 5.0 g/dL Final   AST 04/10/2022 36  15 - 41 U/L Final   ALT 04/10/2022 32  0 - 44 U/L Final   Alkaline Phosphatase 04/10/2022 59  38 - 126 U/L Final   Total Bilirubin 04/10/2022 1.1  0.3 - 1.2 mg/dL Final   GFR, Estimated 04/10/2022 >60  >60 mL/min Final   Comment: (NOTE) Calculated using the CKD-EPI Creatinine Equation (2021)    Anion gap 04/10/2022 10  5 - 15 Final   Performed at [facility], [place] [place], [place] [address]   Lipase 04/10/2022 27  11 - 51 U/L Final   Performed at [facility], [place] [place], [place] [address]   SARS Coronavirus 2 by RT PCR 04/10/2022 NEGATIVE  NEGATIVE Final   Comment: (NOTE) SARS-CoV-2 target nucleic acids are NOT DETECTED.  The SARS-CoV-2 RNA is generally detectable in upper respiratory specimens during the acute phase of infection. The lowest concentration of SARS-CoV-2 viral copies this assay can detect is 138 copies/mL. A negative result does not preclude SARS-Cov-2 infection and should not be used as the sole basis for treatment or other patient management decisions. A negative result may occur with  improper specimen collection/handling, submission of specimen other than nasopharyngeal swab, presence of viral mutation(s) within the areas targeted by this assay, and inadequate number of viral copies(<138 copies/mL). A negative result must be combined with clinical observations, patient history, and epidemiological information. The expected result is Negative.  Fact Sheet for Patients:  EntrepreneurPulse.com.au  Fact Sheet for Healthcare Providers:  IncredibleEmployment.be  This test is no                          t yet approved or cleared by the [place]  FDA and  has been authorized for detection and/or diagnosis of SARS-CoV-2 by FDA under an Emergency Use Authorization (EUA). This EUA will remain  in effect (meaning this test can be used) for the duration of the COVID-19 declaration under Section 564(b)(1) of the Act, 21 U.S.C.section 360bbb-3(b)(1), unless the authorization is terminated  or revoked sooner.  Influenza A by PCR 04/10/2022 NEGATIVE  NEGATIVE Final   Influenza B by PCR 04/10/2022 NEGATIVE  NEGATIVE Final   Comment: (NOTE) The Xpert Xpress SARS-CoV-2/FLU/RSV plus assay is intended as an aid in the diagnosis of influenza from Nasopharyngeal swab specimens and should not be used as a sole basis for treatment. Nasal washings and aspirates are unacceptable for Xpert Xpress SARS-CoV-2/FLU/RSV testing.  Fact Sheet for Patients: EntrepreneurPulse.com.au  Fact Sheet for Healthcare Providers: IncredibleEmployment.be  This test is not yet approved or cleared by the [place] FDA and has been authorized for detection and/or diagnosis of SARS-CoV-2 by FDA under an Emergency Use Authorization (EUA). This EUA will remain in effect (meaning this test can be used) for the duration of the COVID-19 declaration under Section 564(b)(1) of the Act, 21 U.S.C. section 360bbb-3(b)(1), unless the authorization is terminated or revoked.     Resp Syncytial Virus by PCR 04/10/2022 NEGATIVE  NEGATIVE Final   Comment: (NOTE) Fact Sheet for Patients: EntrepreneurPulse.com.au  Fact Sheet for Healthcare Providers: IncredibleEmployment.be  This test is not yet approved or cleared by the [place] FDA and has been authorized for detection and/or diagnosis of SARS-CoV-2 by FDA under an Emergency Use Authorization (EUA). This EUA will remain in effect (meaning this test can be used) for the duration of the COVID-19 declaration under Section 564(b)(1) of the  Act, 21 U.S.C. section 360bbb-3(b)(1), unless the authorization is terminated or revoked.  Performed at [facility], [address] [place], [place] [address]    Troponin I (High Sensitivity) 04/10/2022 3  <18 ng/L Final   Comment: (NOTE) Elevated high sensitivity troponin I (hsTnI) values and significant  changes across serial measurements may suggest ACS but many other  chronic and acute conditions are known to elevate hsTnI results.  Refer to the "Links" section for chest pain algorithms and additional  guidance. Performed at [facility], [address] [place], [place] [address]    Alcohol, Ethyl (B) 04/10/2022 121 (H)  <10 mg/dL Final   Comment: (NOTE) Lowest detectable limit for serum alcohol is 10 mg/dL.  For medical purposes only. Performed at [facility], [address] [place], [place] [address]    Opiates 04/10/2022 NONE DETECTED  NONE DETECTED Final   Cocaine 04/10/2022 POSITIVE (A)  NONE DETECTED Final   Benzodiazepines 04/10/2022 NONE DETECTED  NONE DETECTED Final   Amphetamines 04/10/2022 NONE DETECTED  NONE DETECTED Final   Tetrahydrocannabinol 04/10/2022 POSITIVE (A)  NONE DETECTED Final   Barbiturates 04/10/2022 NONE DETECTED  NONE DETECTED Final   Comment: (NOTE) DRUG SCREEN FOR MEDICAL PURPOSES ONLY.  IF CONFIRMATION IS NEEDED FOR ANY PURPOSE, NOTIFY LAB WITHIN 5 DAYS.  LOWEST DETECTABLE LIMITS FOR URINE DRUG SCREEN Drug Class                     Cutoff (ng/mL) Amphetamine and metabolites    1000 Barbiturate and metabolites    200 Benzodiazepine                 200 Opiates and metabolites        300 Cocaine and metabolites        300 THC                            50 Performed at [facility], [address] [place], [place] [address]   Admission on 12/01/2021,  Discharged on 12/04/2021  Component Date Value Ref Range Status   Sodium 12/01/2021 143  135 - 145 mmol/L Final   Potassium 12/01/2021  4.1  3.5 - 5.1 mmol/L Final   Chloride 12/01/2021 109  98 - 111 mmol/L Final   CO2 12/01/2021 22  22 - 32 mmol/L Final   Glucose, Bld 12/01/2021 90  70 - 99 mg/dL Final   Glucose reference range applies only to samples taken after fasting for at least 8 hours.   BUN 12/01/2021 19  6 - 20 mg/dL Final   QA FLAGS AND/OR RANGES MODIFIED BY DEMOGRAPHIC UPDATE ON 10/13 AT 2240   Creatinine, Ser 12/01/2021 1.13  0.61 - 1.24 mg/dL Final   Calcium 12/01/2021 9.1  8.9 - 10.3 mg/dL Final   Total Protein 12/01/2021 6.6  6.5 - 8.1 g/dL Final   Albumin 12/01/2021 4.0  3.5 - 5.0 g/dL Final   AST 12/01/2021 76 (H)  15 - 41 U/L Final   ALT 12/01/2021 75 (H)  0 - 44 U/L Final   Alkaline Phosphatase 12/01/2021 52  38 - 126 U/L Final   Total Bilirubin 12/01/2021 0.6  0.3 - 1.2 mg/dL Final   GFR, Estimated 12/01/2021 44 (L)  >60 mL/min Final   Comment: (NOTE) Calculated using the CKD-EPI Creatinine Equation (2021)    Anion gap 12/01/2021 12  5 - 15 Final   Performed at [facility], [place] [address], [place], [place] [address]   Sodium 12/01/2021 144  135 - 145 mmol/L Final   Potassium 12/01/2021 3.8  3.5 - 5.1 mmol/L Final   Chloride 12/01/2021 106  98 - 111 mmol/L Final   BUN 12/01/2021 21  8 - 23 mg/dL Final   Creatinine, Ser 12/01/2021 1.50 (H)  0.61 - 1.24 mg/dL Final   Glucose, Bld 12/01/2021 92  70 - 99 mg/dL Final   Glucose reference range applies only to samples taken after fasting for at least 8 hours.   Calcium, Ion 12/01/2021 1.10 (L)  1.15 - 1.40 mmol/L Final   TCO2 12/01/2021 26  22 - 32 mmol/L Final   Hemoglobin 12/01/2021 15.3  13.0 - 17.0 g/dL Final   HCT 12/01/2021 45.0  39.0 - 52.0 % Final   WBC 12/01/2021 9.0  4.0 - 10.5 K/uL Final   RBC 12/01/2021 5.03  4.22 - 5.81 MIL/uL Final   Hemoglobin 12/01/2021 14.5  13.0 - 17.0 g/dL Final   HCT 12/01/2021 41.9  39.0 - 52.0 % Final   MCV 12/01/2021 83.3  80.0 - 100.0 fL Final   MCH 12/01/2021 28.8  26.0 - 34.0 pg Final   MCHC  12/01/2021 34.6  30.0 - 36.0 g/dL Final   RDW 12/01/2021 13.7  11.5 - 15.5 % Final   Platelets 12/01/2021 184  150 - 400 K/uL Final   nRBC 12/01/2021 0.0  0.0 - 0.2 % Final   Performed at [facility], [place] [address] [place], [place] [address]   Alcohol, Ethyl (B) 12/01/2021 286 (H)  <10 mg/dL Final   Comment: (NOTE) Lowest detectable limit for serum alcohol is 10 mg/dL.  For medical purposes only. Performed at [facility], [place] [address] [place], [place] [address]    Color, Urine 12/01/2021 YELLOW  YELLOW Final   APPearance 12/01/2021 CLEAR  CLEAR Final   Specific Gravity, Urine 12/01/2021 >1.046 (H)  1.005 - 1.030 Final   pH 12/01/2021 5.0  5.0 - 8.0 Final   Glucose, UA 12/01/2021 NEGATIVE  NEGATIVE mg/dL Final   Hgb urine dipstick 12/01/2021 NEGATIVE  NEGATIVE Final   Bilirubin Urine 12/01/2021 NEGATIVE  NEGATIVE Final   Ketones, ur 12/01/2021 NEGATIVE  NEGATIVE mg/dL Final   Protein, ur 12/01/2021 NEGATIVE  NEGATIVE mg/dL Final   Nitrite 12/01/2021 NEGATIVE  NEGATIVE Final   Leukocytes,Ua 12/01/2021 NEGATIVE  NEGATIVE Final   Performed at [facility], [place] [address] [place], [place] [address]   Lactic Acid, Venous 12/01/2021 2.3 (HH)  0.5 - 1.9 mmol/L Final   Comment: CRITICAL RESULT CALLED TO, READ BACK BY AND VERIFIED WITH Clearence Cheek, RN, 2142, 12/01/21, EADEDOKUN Performed at [facility], [place] [address], [place], [place] [address]    Prothrombin Time 12/01/2021 11.6  11.4 - 15.2 seconds Final   INR 12/01/2021 0.9  0.8 - 1.2 Final   Comment: (NOTE) INR goal varies based on device and disease states. Performed at [facility], [place] [address], [place], [place] [address]    Blood Bank Specimen 12/01/2021 SAMPLE AVAILABLE FOR TESTING   Final   Sample Expiration 12/01/2021    Final                   Value:12/02/2021,2359 Performed at [facility], [place] [address] [place], [place] [address]    Triglycerides 12/02/2021 180 (H)  <150 mg/dL  Final   Performed at [facility] [address], [place], [place] [address]   Opiates 12/01/2021 NONE DETECTED  NONE DETECTED Final   Cocaine 12/01/2021 POSITIVE (A)  NONE DETECTED Final   Benzodiazepines 12/01/2021 NONE DETECTED  NONE DETECTED Final   Amphetamines 12/01/2021 NONE DETECTED  NONE DETECTED Final   Tetrahydrocannabinol 12/01/2021 POSITIVE (A)  NONE DETECTED Final   Barbiturates 12/01/2021 NONE DETECTED  NONE DETECTED Final   Comment: (NOTE) DRUG SCREEN FOR MEDICAL PURPOSES ONLY.  IF CONFIRMATION IS NEEDED FOR ANY PURPOSE, NOTIFY LAB WITHIN 5 DAYS.  LOWEST DETECTABLE LIMITS FOR URINE DRUG SCREEN Drug Class                     Cutoff (ng/mL) Amphetamine and metabolites    1000 Barbiturate and metabolites    200 Benzodiazepine                 200 Opiates and metabolites        300 Cocaine and metabolites        300 THC                            50 Performed at [facility], [place] [address], [place], [place] [address]    Sodium 12/02/2021 144  135 - 145 mmol/L Final   Potassium 12/02/2021 3.7  3.5 - 5.1 mmol/L Final   Chloride 12/02/2021 112 (H)  98 - 111 mmol/L Final   CO2 12/02/2021 23  22 - 32 mmol/L Final   Glucose, Bld 12/02/2021 94  70 - 99 mg/dL Final   Glucose reference range applies only to samples taken after fasting for at least 8 hours.   BUN 12/02/2021 18  6 - 20 mg/dL Final   Creatinine, Ser 12/02/2021 1.10  0.61 - 1.24 mg/dL Final   Calcium 12/02/2021 8.2 (L)  8.9 - 10.3 mg/dL Final   GFR, Estimated 12/02/2021 >60  >60 mL/min Final   Comment: (NOTE) Calculated using the CKD-EPI Creatinine Equation (2021)    Anion gap 12/02/2021 9  5 - 15 Final   Performed at [facility], [place] [address], [place], [place] [address]   WBC 12/02/2021 10.5  4.0 - 10.5 K/uL Final   RBC 12/02/2021 4.56  4.22 - 5.81 MIL/uL Final   Hemoglobin 12/02/2021 12.8 (L)  13.0 - 17.0 g/dL Final   HCT 12/02/2021 37.9 (L)  39.0 - 52.0 % Final   MCV 12/02/2021  83.1  80.0 - 100.0 fL Final   MCH 12/02/2021 28.1  26.0 - 34.0 pg Final   MCHC 12/02/2021 33.8  30.0 - 36.0 g/dL Final   RDW 12/02/2021 13.6  11.5 - 15.5 % Final   Platelets 12/02/2021 170  150 - 400 K/uL Final   nRBC 12/02/2021 0.0  0.0 - 0.2 % Final   Performed at [facility] [address] [place], [place] [address]   pH, Arterial 12/01/2021 7.314 (L)  7.35 - 7.45 Final   pCO2 arterial 12/01/2021 47.4  32 - 48 mmHg Final   pO2, Arterial 12/01/2021 101  83 - 108 mmHg Final   Bicarbonate 12/01/2021 24.1  20.0 - 28.0 mmol/L Final   TCO2 12/01/2021 26  22 - 32 mmol/L Final   O2 Saturation 12/01/2021 97  % Final   Acid-base deficit 12/01/2021 2.0  0.0 - 2.0 mmol/L Final   Sodium 12/01/2021 139  135 - 145 mmol/L Final   Potassium 12/01/2021 3.8  3.5 - 5.1 mmol/L Final   Calcium, Ion 12/01/2021 1.12 (L)  1.15 - 1.40 mmol/L Final   HCT 12/01/2021 35.0 (L)  39.0 - 52.0 % Final   Hemoglobin 12/01/2021 11.9 (L)  13.0 - 17.0 g/dL Final   Collection site 12/01/2021 RADIAL, ALLEN'S TEST ACCEPTABLE   Final   Drawn by 12/01/2021 RT   Final   Sample type 12/01/2021 ARTERIAL   Final   Sodium 12/01/2021 144  135 - 145 mmol/L Final   Potassium 12/01/2021 3.8  3.5 - 5.1 mmol/L Final   Chloride 12/01/2021 106  98 - 111 mmol/L Final   BUN 12/01/2021 21 (H)  6 - 20 mg/dL Final   QA FLAGS AND/OR RANGES MODIFIED BY DEMOGRAPHIC UPDATE ON 10/13 AT 2240   Creatinine, Ser 12/01/2021 1.50 (H)  0.61 - 1.24 mg/dL Final   Glucose, Bld 12/01/2021 92  70 - 99 mg/dL Final   Glucose reference range applies only to samples taken after fasting for at least 8 hours.   Calcium, Ion 12/01/2021 1.10 (L)  1.15 - 1.40 mmol/L Final   TCO2 12/01/2021 26  22 - 32 mmol/L Final   Hemoglobin 12/01/2021 15.3  13.0 - 17.0 g/dL Final   HCT 12/01/2021 45.0  39.0 - 52.0 % Final   MRSA by PCR Next Gen 12/01/2021 NOT DETECTED  NOT DETECTED Final   Comment: (NOTE) The GeneXpert MRSA Assay (FDA approved for NASAL specimens  only), is one component of a comprehensive MRSA colonization surveillance program. It is not intended to diagnose MRSA infection nor to guide or monitor treatment for MRSA infections. Test performance is not FDA approved in patients less than 41 years old. Performed at [facility], [place] [address], [place], [place] [address]    Sodium 12/03/2021 135  135 - 145 mmol/L Final   DELTA CHECK NOTED   Potassium 12/03/2021 3.4 (L)  3.5 - 5.1 mmol/L Final   Chloride 12/03/2021 103  98 - 111 mmol/L Final   CO2 12/03/2021 25  22 - 32 mmol/L Final   Glucose, Bld 12/03/2021  104 (H)  70 - 99 mg/dL Final   Glucose reference range applies only to samples taken after fasting for at least 8 hours.   BUN 12/03/2021 16  6 - 20 mg/dL Final   Creatinine, Ser 12/03/2021 1.08  0.61 - 1.24 mg/dL Final   Calcium 12/03/2021 8.1 (L)  8.9 - 10.3 mg/dL Final   GFR, Estimated 12/03/2021 >60  >60 mL/min Final   Comment: (NOTE) Calculated using the CKD-EPI Creatinine Equation (2021)    Anion gap 12/03/2021 7  5 - 15 Final   Performed at [facility], [place] [address], [place], [place] [address]   WBC 12/03/2021 9.0  4.0 - 10.5 K/uL Final   RBC 12/03/2021 4.47  4.22 - 5.81 MIL/uL Final   Hemoglobin 12/03/2021 12.4 (L)  13.0 - 17.0 g/dL Final   HCT 12/03/2021 37.1 (L)  39.0 - 52.0 % Final   MCV 12/03/2021 83.0  80.0 - 100.0 fL Final   MCH 12/03/2021 27.7  26.0 - 34.0 pg Final   MCHC 12/03/2021 33.4  30.0 - 36.0 g/dL Final   RDW 12/03/2021 13.3  11.5 - 15.5 % Final   Platelets 12/03/2021 145 (L)  150 - 400 K/uL Final   nRBC 12/03/2021 0.0  0.0 - 0.2 % Final   Performed at [facility] [address] [place], [place] [address]   Glucose-Capillary 12/02/2021 135 (H)  70 - 99 mg/dL Final   Glucose reference range applies only to samples taken after fasting for at least 8 hours.   Sodium 12/04/2021 134 (L)  135 - 145 mmol/L Final   Potassium 12/04/2021 3.4 (L)  3.5 - 5.1 mmol/L Final   Chloride  12/04/2021 99  98 - 111 mmol/L Final   CO2 12/04/2021 27  22 - 32 mmol/L Final   Glucose, Bld 12/04/2021 115 (H)  70 - 99 mg/dL Final   Glucose reference range applies only to samples taken after fasting for at least 8 hours.   BUN 12/04/2021 14  6 - 20 mg/dL Final   Creatinine, Ser 12/04/2021 1.16  0.61 - 1.24 mg/dL Final   Calcium 12/04/2021 8.7 (L)  8.9 - 10.3 mg/dL Final   GFR, Estimated 12/04/2021 >60  >60 mL/min Final   Comment: (NOTE) Calculated using the CKD-EPI Creatinine Equation (2021)    Anion gap 12/04/2021 8  5 - 15 Final   Performed at [facility], [place] [address] [place], [place] [address]   WBC 12/04/2021 9.1  4.0 - 10.5 K/uL Final   RBC 12/04/2021 4.28  4.22 - 5.81 MIL/uL Final   Hemoglobin 12/04/2021 12.2 (L)  13.0 - 17.0 g/dL Final   HCT 12/04/2021 35.2 (L)  39.0 - 52.0 % Final   MCV 12/04/2021 82.2  80.0 - 100.0 fL Final   MCH 12/04/2021 28.5  26.0 - 34.0 pg Final   MCHC 12/04/2021 34.7  30.0 - 36.0 g/dL Final   RDW 12/04/2021 12.6  11.5 - 15.5 % Final   Platelets 12/04/2021 143 (L)  150 - 400 K/uL Final   REPEATED TO VERIFY   nRBC 12/04/2021 0.0  0.0 - 0.2 % Final   Performed at [facility], [place] [address], [place], [place] [address]  Admission on 10/21/2021, Discharged on 10/22/2021  Component Date Value Ref Range Status   Sodium 10/21/2021 144  135 - 145 mmol/L Final   Potassium 10/21/2021 4.2  3.5 - 5.1 mmol/L Final   Chloride 10/21/2021 110  98 - 111 mmol/L Final   CO2 10/21/2021 23  22 - 32 mmol/L Final   Glucose, Bld 10/21/2021 88  70 - 99 mg/dL Final   Glucose reference range applies only to samples taken after fasting for at least 8 hours.   BUN 10/21/2021 13  6 - 20 mg/dL Final   Creatinine, Ser 10/21/2021 1.27 (H)  0.61 - 1.24 mg/dL Final   Calcium 10/21/2021 9.3  8.9 - 10.3 mg/dL Final   Total Protein 10/21/2021 6.4 (L)  6.5 - 8.1 g/dL Final   Albumin 10/21/2021 3.9  3.5 - 5.0 g/dL Final   AST 10/21/2021 21  15 - 41 U/L Final   ALT  10/21/2021 19  0 - 44 U/L Final   Alkaline Phosphatase 10/21/2021 50  38 - 126 U/L Final   Total Bilirubin 10/21/2021 0.6  0.3 - 1.2 mg/dL Final   GFR, Estimated 10/21/2021 >60  >60 mL/min Final   Comment: (NOTE) Calculated using the CKD-EPI Creatinine Equation (2021)    Anion gap 10/21/2021 11  5 - 15 Final   Performed at [facility] [address], [place], [place] [address]   WBC 10/21/2021 9.7  4.0 - 10.5 K/uL Final   RBC 10/21/2021 5.07  4.22 - 5.81 MIL/uL Final   Hemoglobin 10/21/2021 14.3  13.0 - 17.0 g/dL Final   HCT 10/21/2021 42.9  39.0 - 52.0 % Final   MCV 10/21/2021 84.6  80.0 - 100.0 fL Final   MCH 10/21/2021 28.2  26.0 - 34.0 pg Final   MCHC 10/21/2021 33.3  30.0 - 36.0 g/dL Final   RDW 10/21/2021 12.9  11.5 - 15.5 % Final   Platelets 10/21/2021 257  150 - 400 K/uL Final   nRBC 10/21/2021 0.0  0.0 - 0.2 % Final   Performed at [facility] [address] [place], [place] [address]    Allergies: Patient has no known allergies.  Medications:  PTA Medications  Medication Sig   famotidine (PEPCID) 20 MG tablet Take 1 tablet (20 mg total) by mouth 2 (two) times daily.   ondansetron (ZOFRAN-ODT) 4 MG disintegrating tablet Take 1 tablet (4 mg total) by mouth every 8 (eight) hours as needed for nausea or vomiting.   Facility Ordered Medications  Medication   acetaminophen (TYLENOL) tablet 650 mg   alum & mag hydroxide-simeth (MAALOX/MYLANTA) 200-200-20 MG/5ML suspension 30 mL   magnesium hydroxide (MILK OF MAGNESIA) suspension 30 mL   hydrOXYzine (ATARAX) tablet 25 mg   traZODone (DESYREL) tablet 50 mg   thiamine (VITAMIN B1) injection 100 mg   [START ON 04/18/2022] thiamine (VITAMIN B1) tablet 100 mg   multivitamin with minerals tablet 1 tablet   LORazepam (ATIVAN) tablet 1 mg   hydrOXYzine (ATARAX) tablet 25 mg   loperamide (IMODIUM) capsule 2-4 mg   ondansetron (ZOFRAN-ODT) disintegrating tablet 4 mg    Medical Decision Making  Patient presents to  the [facility] requesting substance abuse treatment for alcohol abuse and cocaine use.  He was seen earlier and was offered admission to the observation unit.  However a bed was obtained at [facility] but it will not be available until 04/18/2022 at 5 PM.  Patient declined admission to the observation unit.  He returned this evening stating he did not feel that he could keep himself away from alcohol or cocaine.  He will be admitted to the continuous assessment unit and started on a CIWA protocol.  He will be discharged tomorrow to [facility].  Recommendations  Based on my evaluation the patient does not appear to have an emergency medical condition.  He will be admitted to the continuous assessment unit and started on a CIWA protocol.  He will be discharged tomorrow to [facility].  Lab Orders         Resp panel by RT-PCR (RSV, Flu A&B, Covid) Anterior Nasal Swab         Ethanol         POCT Urine Drug Screen - (I-Screen)      Revonda Humphrey, NP 04/17/22  7:19 PM

## 2022-04-18 LAB — HEMOGLOBIN A1C
Hgb A1c MFr Bld: 5.7 % — ABNORMAL HIGH (ref 4.8–5.6)
Mean Plasma Glucose: 117 mg/dL

## 2022-04-18 NOTE — ED Notes (Signed)
Patient resting quietly in bed with eyes closed. Respirations equal and unlabored, skin warm and dry, NAD. Routine safety checks conducted according to facility protocol. Will continue to monitor for safety.  

## 2022-04-18 NOTE — ED Notes (Signed)
[facility] called for pick up for transport to [facility].

## 2022-04-18 NOTE — ED Notes (Signed)
Pt returns with alcohol and cocaine abuse.  Drinks beer daily, 1-2 cases a day. Also admits to cocaine use.  Pt accepted to [facility] , 04/18/22 at 5pm.  Passive SI noted.  Contracts for safety. Monitoring for safety.

## 2022-04-18 NOTE — ED Notes (Signed)
Pt sleeping at present, no distress noted.  Monitoring for safety. 

## 2022-04-18 NOTE — ED Provider Notes (Signed)
FBC/OBS ASAP Discharge Summary  Date and Time: 04/18/2022 9:04 AM  Name: Theodore Hendrix  MRN:  [id number]   Discharge Diagnoses:  Final diagnoses:  Substance induced mood disorder ([place])  Polysubstance abuse ([place])   [id number] Theodore Hendrix 32 y.o., male patient presented to [facility] earlier in the day via law enforcement after calling stating that he needs help with drug use.  He was evaluated and was offered admission to the observation unit while awaiting a bed at [facility].  He declined at that time.  He presents at this time willing to accept bed at [facility].  He does not feel that he can keep himself away from alcohol or cocaine if he were to be discharged.  He will be admitted to the continuous assessment unit and will be discharged to [facility] on 04/18/2022 at 5 PM.   Patient seen face to face by this provider chart reviewed on 04/18/22.  Per chart review patient has a past psychiatric history of polysubstance abuse (cocaine and alcohol).  Subjective:   On today's reevaluation patient is observed laying in his bed asleep.  He is easily awakened.  He is alert/oriented x 4, calm, cooperative, and attentive.  He is disheveled with normal speech and behavior.  He endorses a good night sleep and states, "I I am starving".  He is denying any SI/HI/AVH on today's assessment.  He verbally contracts for safety.  He denies any access to firearms/weapons.  He does not appear to be responding to internal/external stimuli.  He is able to answer questions appropriately.  He does endorse some anxiety due to going to [facility] today.  He appears to be somewhat apprehensive about a 28-day program.  Provided reassurance and encouragement, and support.  Stay Summary:   Patient has remained calm and cooperative while on the unit.  He is been appropriate with staff and other patients.  He has required no as needed medications for agitation.  He has been [facility] to [facility] for [facility] treatment.  Patient is agreeable.  He will be discharged  and transported to [facility] for his intake appointment at 5 PM.    Total Time spent with patient: 30 minutes  Past Psychiatric History: as documented in H&P Past Medical History:  as documented in H&P Family History:  as documented in H&P Family Psychiatric History:  as documented in H&P Social History:  as documented in H&P Tobacco Cessation:  N/A, patient does not currently use tobacco products  Current Medications:  Current Facility-Administered Medications  Medication Dose Route Frequency Provider Last Rate Last Admin   acetaminophen (TYLENOL) tablet 650 mg  650 mg Oral Q6H PRN Revonda Humphrey, NP       alum & mag hydroxide-simeth (MAALOX/MYLANTA) 200-200-20 MG/5ML suspension 30 mL  30 mL Oral Q4H PRN Revonda Humphrey, NP       hydrOXYzine (ATARAX) tablet 25 mg  25 mg Oral TID PRN Revonda Humphrey, NP       hydrOXYzine (ATARAX) tablet 25 mg  25 mg Oral Q6H PRN Revonda Humphrey, NP   25 mg at 04/17/22 2135   loperamide (IMODIUM) capsule 2-4 mg  2-4 mg Oral PRN Revonda Humphrey, NP       LORazepam (ATIVAN) tablet 1 mg  1 mg Oral Q6H PRN Revonda Humphrey, NP       magnesium hydroxide (MILK OF MAGNESIA) suspension 30 mL  30 mL Oral Daily PRN Revonda Humphrey, NP       multivitamin with minerals  tablet 1 tablet  1 tablet Oral Daily Revonda Humphrey, NP   1 tablet at 04/17/22 2136   ondansetron (ZOFRAN-ODT) disintegrating tablet 4 mg  4 mg Oral Q6H PRN Revonda Humphrey, NP       thiamine (VITAMIN B1) tablet 100 mg  100 mg Oral Daily Revonda Humphrey, NP       traZODone (DESYREL) tablet 50 mg  50 mg Oral QHS PRN Revonda Humphrey, NP   50 mg at 04/17/22 2136   No current outpatient medications on file.    PTA Medications:  Facility Ordered Medications  Medication   acetaminophen (TYLENOL) tablet 650 mg   alum & mag hydroxide-simeth (MAALOX/MYLANTA) 200-200-20 MG/5ML suspension 30 mL   magnesium hydroxide (MILK OF MAGNESIA) suspension 30 mL   hydrOXYzine (ATARAX)  tablet 25 mg   traZODone (DESYREL) tablet 50 mg   [COMPLETED] thiamine (VITAMIN B1) injection 100 mg   thiamine (VITAMIN B1) tablet 100 mg   multivitamin with minerals tablet 1 tablet   LORazepam (ATIVAN) tablet 1 mg   hydrOXYzine (ATARAX) tablet 25 mg   loperamide (IMODIUM) capsule 2-4 mg   ondansetron (ZOFRAN-ODT) disintegrating tablet 4 mg        No data to display          [place] [facility] from 04/17/2022 in [facility] Most recent reading at 04/18/2022 12:16 AM ED from 04/17/2022 in [facility] Most recent reading at 04/17/2022 11:10 AM ED from 04/14/2022 in [facility] at [facility] Most recent reading at 04/14/2022  9:30 PM  C-SSRS RISK CATEGORY Moderate Risk High Risk No Risk       Musculoskeletal  Strength & Muscle Tone: within normal limits Gait & Station: normal Patient leans: N/A  Psychiatric Specialty Exam  Presentation  General Appearance:  Disheveled  Eye Contact: Good  Speech: Clear and Coherent; Normal Rate  Speech Volume: Normal  Handedness: Right   Mood and Affect  Mood: Dysphoric  Affect: Congruent   Thought Process  Thought Processes: Coherent  Descriptions of Associations:Intact  Orientation:Full (Time, Place and Person)  Thought Content:Logical  Diagnosis of Schizophrenia or Schizoaffective disorder in past: No    Hallucinations:Hallucinations: None  Ideas of Reference:None  Suicidal Thoughts:Suicidal Thoughts: No SI Passive Intent and/or Plan: Without Intent; Without Plan; Without Access to Means  Homicidal Thoughts:Homicidal Thoughts: No   Sensorium  Memory: Immediate Good; Recent Good; Remote Good  Judgment: Intact  Insight: Fair   Community education officer  Concentration: Good  Attention Span: Good  Recall: Good  Fund of Knowledge: Good  Language: Good   Psychomotor Activity  Psychomotor  Activity: Psychomotor Activity: Normal   Assets  Assets: Communication Skills; Desire for Improvement; Physical Health; Resilience; Social Support   Sleep  Sleep: Sleep: Fair   Nutritional Assessment (For OBS and FBC admissions only) Has the patient had a weight loss or gain of 10 pounds or more in the last 3 months?: No Has the patient had a decrease in food intake/or appetite?: No Does the patient have dental problems?: No Does the patient have eating habits or behaviors that may be indicators of an eating disorder including binging or inducing vomiting?: No Has the patient recently lost weight without trying?: 0 Has the patient been eating poorly because of a decreased appetite?: 0 Malnutrition Screening Tool Score: 0    Physical Exam  Physical Exam Vitals and nursing note reviewed.  Constitutional:  General: He is not in acute distress.    Appearance: Normal appearance. He is well-developed.  HENT:     Head: Normocephalic and atraumatic.  Eyes:     General:        Right eye: No discharge.        Left eye: No discharge.     Conjunctiva/sclera: Conjunctivae normal.  Cardiovascular:     Rate and Rhythm: Normal rate.  Pulmonary:     Effort: Pulmonary effort is normal. No respiratory distress.  Musculoskeletal:        General: Normal range of motion.     Cervical back: Normal range of motion.  Skin:    Coloration: Skin is not jaundiced or pale.  Neurological:     Mental Status: He is alert and oriented to person, place, and time.  Psychiatric:        Attention and Perception: Attention and perception normal.        Mood and Affect: Mood normal.        Speech: Speech normal.        Behavior: Behavior normal. Behavior is cooperative.        Thought Content: Thought content normal.        Cognition and Memory: Cognition normal.        Judgment: Judgment normal.    Review of Systems  Constitutional: Negative.   HENT: Negative.    Eyes: Negative.    Respiratory: Negative.    Cardiovascular: Negative.   Musculoskeletal: Negative.   Skin: Negative.   Neurological: Negative.   Psychiatric/Behavioral:  Positive for substance abuse.    Blood pressure 136/88, pulse 75, temperature 98.3 F (36.8 C), temperature source Oral, resp. rate 17, SpO2 100 %. There is no height or weight on file to calculate BMI.  Demographic Factors:  Male, Low socioeconomic status, Living alone, and Unemployed  Loss Factors: Financial problems/change in socioeconomic status  Historical Factors: Impulsivity  Risk Reduction Factors:   Sense of responsibility to family, Positive therapeutic relationship, and Positive coping skills or problem solving skills  Continued Clinical Symptoms:  Severe Anxiety and/or Agitation Depression:   Comorbid alcohol abuse/dependence Impulsivity Alcohol/Substance Abuse/Dependencies  Cognitive Features That Contribute To Risk:  None    Suicide Risk:  Minimal: No identifiable suicidal ideation.  Patients presenting with no risk factors but with morbid ruminations; may be classified as minimal risk based on the severity of the depressive symptoms  Plan Of Care/Follow-up recommendations:  Activity:  as tolerated  Diet:  regular  Disposition: Discharge patient   [facility] for [facility].  He will be discharged and transported via safe transport today  Revonda Humphrey, NP 04/18/2022, 9:04 AM

## 2022-04-18 NOTE — ED Notes (Signed)
Pt sleeping in recliner bed. RR even and unlabored. No noted distress. Will continue to monitor for saety

## 2022-04-18 NOTE — ED Notes (Signed)
Pt was given 2 muffins and juice for breakfast.

## 2022-04-18 NOTE — Discharge Instructions (Addendum)
Discharge patient. Will provide transportation to [facility] for residential Treatment.   CHARITABLE RESIDENTIAL REHABS  [facility] (women only at this campus) [place], [place] [address] [phone]  [facility] (men only at this campus) [place], [place] [address] [phone]  ((These programs listed above have a one-time application fee.))   [facility] [place]. [place], [place], [address] Men's Program - Roderic Ovens [place] - [id number] Women's Program - Delma Freeze [phone] Intake Coordinator - [phone]  (19-monthprogram offering Hep C treatment to anyone who reaches the 934-monthark in their program.  Free of cost.  No controlled substances allowed for medications.  No heavy anti-psychotics like the ones that treat schizophrenia or Seroquel.  Transportation for pick-up is available.)   DeChesapeake Energy[address] CircleNC [phone](DeSiesta Keys not for everyone.  They have stringent acceptance criteria.  No pending legal issues, no psychotropic medications, and zero history of SI/HI/AVH. This is a free program, but it is a high end program.)  DuR[address]ena[address] Whit[address]NC [phone] CitySuMontana [phone] phone  (This is a halfway house for WOColeman18+.  Residential is 12 months, and non-residential is 6-12 months depending on progress.)  MaBurman Nievesnd ErCendant Corporation[address] High Ridge [phone] phone [phone]  (WWenatchee Valley Hospital D[facility]fluence [facility] Moses [facility] Ascalfway house, 9-12 months)  MaLittle CanadarMount RoyalNC [phone] [phone] Medical Rehabilitation HospitalRehab for men only) [address]tSt. JohnNC [phone] REProvidence HoHenrico Doctors' H[phone]'s DiPrairie [phone] DETOX/RESIDENTIAL TREATMENT- MEDICAID/IPRS:  ARCA [id number] WiBeech Mountain LakesNC [phone]vWilliamsburgNC [phone]orenzoNC [phone] 568 5425DaClatskanie[address] LanetGlenn SpringsNC [phone] PlataNC [phone]((For admissions to these three DaGenesis Behavioral Hospitalacilities during weekday days and possibly other times, contact ChGranite Fallsphone: [phone]: [phone]  DISaginaw Va Medical CenterRIWyoming[address][phone] phone  InAvalonf [place] 42Baneberrya[address] StreetiEllentonNCAlaska2832355Gastonia, [phone] phone [phone]  (Ae[address]BCGoodrichCiIcardMost Medicare and Medicaid, UHMcCooleUninsured/IPRS)  Residential Treatment Services (detox for men and women) 13OneidaNC [phone] DETOX AND RESIDENTIAL TREATMENT - INSURANCE:  Fellowship HaNevada Cranealso offers CD-IOP for graduates of residential program) 51HohenwaldGrBay ShoreNC [phone] Center of GaGlenvilnow accepting ViAlaska11Potlicker FlatsVA [phone] Suburban Spine Center LPaccept FeWellington Edoscopy Centeror some services) [address]WiWakemanNC [phone] PROGRAMS:  [facility]cohol [facility] Drug Services (ADS) 11HampsteadNC [phone]((CD-IOP is currently not operational; Opioid replacement clinic is operational))   CoBanner Elk Clinict GrPhysicians Surgery Center [facility](private insurance) 51Manassas ParkElBlack & DeckerStFranklinNC [phone]  [id number]  ((CD-IOP (afternoon program); individual therapy))   [facility] (Medicaid & IPRS for [place] residents) [place], [place] [address] [phone]  ((CD-IOP; new clients must go through walk-in clinic))   [place] (Medicaid & IPRS for [facility] residents) [address] [place], [place] [address] [phone]  ((CD-IOP))   [facility] (Medicaid for [employer] and [employer]; some private insurance) [address] [place], [place] [address] [phone]  ((CD-IOP))   [facility] ([place] insurance) [phone] [place], [place] [address] [phone]  ((CD-IOP (morning & evening programs); individual therapy))   HALFWAY HOUSES:  Friends of Bill [phone]  [email or web address]   OPIOID REPLACEMENT PROVIDERS:  [facility] Drug Services (ADS) [place], [place] [address] [phone]  [facility] [address] [place], [place] [address] [phone]  [facility] [address] [place],  [address] [phone]   12 STEP PROGRAMS:  Alcoholics Anonymous of [place] [email or web address]  Narcotics Anonymous of [place] GreenScrapbooking.dk  Al-Anon of [place], [place] [email or web address]-[email or web address]  Nar-Anon [email or web address]://nar-[email or web address]-[email or web address]-[email or web address]

## 2022-04-18 NOTE — ED Notes (Signed)
Patient A&Ox4. Patient denies SI/Hi and AVH.  Patient denies any physical complaints when asked. No acute distress noted. Support and encouragement provided. Routine safety checks conducted according to facility protocol. Encouraged patient to notify staff if thoughts of harm toward self or others arise. Patient verbalize understanding and agreement. Will continue to monitor for safety.    

## 2022-04-18 NOTE — ED Notes (Signed)
Patient A&O x 4, ambulatory. Patient discharged in no acute distress. Patient denied SI/HI, A/VH upon discharge. Patient verbalized understanding of all discharge instructions explained by staff, to include follow up appointments, RX's and safety plan. Patient reported mood 10/10.  Pt belongings returned to patient from locker #  7 & 12 intact. Patient escorted to back sally port for transport with safe transport to [facility]. Safety maintained.

## 2022-04-19 LAB — PROLACTIN: Prolactin: 5.1 ng/mL (ref 3.9–22.7)
